# Patient Record
Sex: Male | Born: 1940 | Race: Black or African American | Hispanic: No | Marital: Married | Smoking: Never smoker
Health system: Southern US, Community
[De-identification: ages and names within clinical notes are randomized; demographics above are authoritative.]

## PROBLEM LIST (undated history)

## (undated) DIAGNOSIS — E119 Type 2 diabetes mellitus without complications: Secondary | ICD-10-CM

## (undated) DIAGNOSIS — I10 Essential (primary) hypertension: Secondary | ICD-10-CM

---

## 2006-06-09 ENCOUNTER — Ambulatory Visit: Payer: Self-pay | Admitting: Oncology

## 2006-07-22 ENCOUNTER — Ambulatory Visit: Payer: Self-pay | Admitting: Oncology

## 2016-07-10 ENCOUNTER — Encounter (HOSPITAL_COMMUNITY): Payer: Self-pay | Admitting: Emergency Medicine

## 2016-07-10 ENCOUNTER — Emergency Department (HOSPITAL_COMMUNITY)
Admission: EM | Admit: 2016-07-10 | Discharge: 2016-07-10 | Disposition: A | Discharge status: Home / Self Care | Payer: No Typology Code available for payment source | Attending: Emergency Medicine | Admitting: Emergency Medicine

## 2016-07-10 DIAGNOSIS — T148 Other injury of unspecified body region: Secondary | ICD-10-CM | POA: Diagnosis not present

## 2016-07-10 DIAGNOSIS — Y939 Activity, unspecified: Secondary | ICD-10-CM | POA: Diagnosis not present

## 2016-07-10 DIAGNOSIS — E119 Type 2 diabetes mellitus without complications: Secondary | ICD-10-CM | POA: Insufficient documentation

## 2016-07-10 DIAGNOSIS — R0789 Other chest pain: Secondary | ICD-10-CM | POA: Diagnosis not present

## 2016-07-10 DIAGNOSIS — I10 Essential (primary) hypertension: Secondary | ICD-10-CM | POA: Insufficient documentation

## 2016-07-10 DIAGNOSIS — Y999 Unspecified external cause status: Secondary | ICD-10-CM | POA: Insufficient documentation

## 2016-07-10 DIAGNOSIS — M79651 Pain in right thigh: Secondary | ICD-10-CM | POA: Diagnosis not present

## 2016-07-10 DIAGNOSIS — Y92410 Unspecified street and highway as the place of occurrence of the external cause: Secondary | ICD-10-CM | POA: Diagnosis not present

## 2016-07-10 HISTORY — DX: Essential (primary) hypertension: I10

## 2016-07-10 HISTORY — DX: Type 2 diabetes mellitus without complications: E11.9
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
## 2016-07-10 NOTE — Discharge Instructions (Signed)
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
## 2017-03-08 DIAGNOSIS — R7989 Other specified abnormal findings of blood chemistry: Secondary | ICD-10-CM | POA: Diagnosis not present

## 2017-03-08 DIAGNOSIS — R4182 Altered mental status, unspecified: Secondary | ICD-10-CM | POA: Diagnosis not present

## 2017-03-08 DIAGNOSIS — E1165 Type 2 diabetes mellitus with hyperglycemia: Secondary | ICD-10-CM | POA: Diagnosis not present

## 2017-03-08 DIAGNOSIS — R7309 Other abnormal glucose: Secondary | ICD-10-CM | POA: Diagnosis not present

## 2017-03-08 DIAGNOSIS — R531 Weakness: Secondary | ICD-10-CM | POA: Diagnosis not present

## 2017-03-31 DIAGNOSIS — I10 Essential (primary) hypertension: Secondary | ICD-10-CM | POA: Diagnosis not present

## 2017-03-31 DIAGNOSIS — I252 Old myocardial infarction: Secondary | ICD-10-CM | POA: Diagnosis not present

## 2017-03-31 DIAGNOSIS — E119 Type 2 diabetes mellitus without complications: Secondary | ICD-10-CM | POA: Diagnosis not present

## 2017-03-31 DIAGNOSIS — I4581 Long QT syndrome: Secondary | ICD-10-CM | POA: Diagnosis not present

## 2017-03-31 DIAGNOSIS — R5383 Other fatigue: Secondary | ICD-10-CM | POA: Diagnosis not present

## 2017-03-31 DIAGNOSIS — I499 Cardiac arrhythmia, unspecified: Secondary | ICD-10-CM | POA: Diagnosis not present

## 2017-03-31 DIAGNOSIS — R4781 Slurred speech: Secondary | ICD-10-CM | POA: Diagnosis not present

## 2017-03-31 DIAGNOSIS — I959 Hypotension, unspecified: Secondary | ICD-10-CM | POA: Diagnosis not present

## 2017-03-31 DIAGNOSIS — R350 Frequency of micturition: Secondary | ICD-10-CM | POA: Diagnosis not present

## 2017-03-31 DIAGNOSIS — D696 Thrombocytopenia, unspecified: Secondary | ICD-10-CM | POA: Diagnosis not present

## 2017-05-03 DIAGNOSIS — M109 Gout, unspecified: Secondary | ICD-10-CM | POA: Diagnosis not present

## 2017-05-03 DIAGNOSIS — Z1329 Encounter for screening for other suspected endocrine disorder: Secondary | ICD-10-CM | POA: Diagnosis not present

## 2017-05-03 DIAGNOSIS — E1129 Type 2 diabetes mellitus with other diabetic kidney complication: Secondary | ICD-10-CM | POA: Diagnosis not present

## 2017-05-03 DIAGNOSIS — E1169 Type 2 diabetes mellitus with other specified complication: Secondary | ICD-10-CM | POA: Diagnosis not present

## 2017-05-24 DIAGNOSIS — M109 Gout, unspecified: Secondary | ICD-10-CM | POA: Diagnosis not present

## 2017-05-24 DIAGNOSIS — E1129 Type 2 diabetes mellitus with other diabetic kidney complication: Secondary | ICD-10-CM | POA: Diagnosis not present

## 2017-05-24 DIAGNOSIS — M7989 Other specified soft tissue disorders: Secondary | ICD-10-CM | POA: Diagnosis not present

## 2017-05-24 DIAGNOSIS — E1159 Type 2 diabetes mellitus with other circulatory complications: Secondary | ICD-10-CM | POA: Diagnosis not present

## 2017-08-22 DIAGNOSIS — R55 Syncope and collapse: Secondary | ICD-10-CM | POA: Diagnosis not present

## 2017-08-22 DIAGNOSIS — R079 Chest pain, unspecified: Secondary | ICD-10-CM | POA: Diagnosis not present

## 2017-08-22 DIAGNOSIS — R404 Transient alteration of awareness: Secondary | ICD-10-CM | POA: Diagnosis not present

## 2017-08-22 DIAGNOSIS — I959 Hypotension, unspecified: Secondary | ICD-10-CM | POA: Diagnosis not present

## 2017-09-27 DIAGNOSIS — I129 Hypertensive chronic kidney disease with stage 1 through stage 4 chronic kidney disease, or unspecified chronic kidney disease: Secondary | ICD-10-CM | POA: Diagnosis not present

## 2017-09-27 DIAGNOSIS — R531 Weakness: Secondary | ICD-10-CM | POA: Diagnosis not present

## 2017-09-27 DIAGNOSIS — N189 Chronic kidney disease, unspecified: Secondary | ICD-10-CM | POA: Diagnosis not present

## 2017-10-25 DIAGNOSIS — K7581 Nonalcoholic steatohepatitis (NASH): Secondary | ICD-10-CM | POA: Diagnosis present

## 2017-10-25 DIAGNOSIS — F4489 Other dissociative and conversion disorders: Secondary | ICD-10-CM | POA: Diagnosis not present

## 2017-10-25 DIAGNOSIS — I120 Hypertensive chronic kidney disease with stage 5 chronic kidney disease or end stage renal disease: Secondary | ICD-10-CM | POA: Diagnosis not present

## 2017-10-25 DIAGNOSIS — E119 Type 2 diabetes mellitus without complications: Secondary | ICD-10-CM | POA: Diagnosis not present

## 2017-10-25 DIAGNOSIS — G9341 Metabolic encephalopathy: Secondary | ICD-10-CM | POA: Diagnosis not present

## 2017-10-25 DIAGNOSIS — D696 Thrombocytopenia, unspecified: Secondary | ICD-10-CM | POA: Diagnosis not present

## 2017-10-25 DIAGNOSIS — R402441 Other coma, without documented Glasgow coma scale score, or with partial score reported, in the field [EMT or ambulance]: Secondary | ICD-10-CM | POA: Diagnosis not present

## 2017-10-25 DIAGNOSIS — Z794 Long term (current) use of insulin: Secondary | ICD-10-CM | POA: Diagnosis not present

## 2017-10-25 DIAGNOSIS — R4182 Altered mental status, unspecified: Secondary | ICD-10-CM | POA: Diagnosis not present

## 2017-10-25 DIAGNOSIS — K7290 Hepatic failure, unspecified without coma: Secondary | ICD-10-CM | POA: Diagnosis present

## 2017-10-25 DIAGNOSIS — N185 Chronic kidney disease, stage 5: Secondary | ICD-10-CM | POA: Diagnosis not present

## 2017-10-25 DIAGNOSIS — K7460 Unspecified cirrhosis of liver: Secondary | ICD-10-CM | POA: Diagnosis not present

## 2017-10-25 DIAGNOSIS — Z79899 Other long term (current) drug therapy: Secondary | ICD-10-CM | POA: Diagnosis not present

## 2017-10-25 DIAGNOSIS — K8020 Calculus of gallbladder without cholecystitis without obstruction: Secondary | ICD-10-CM | POA: Diagnosis not present

## 2017-10-25 DIAGNOSIS — E1122 Type 2 diabetes mellitus with diabetic chronic kidney disease: Secondary | ICD-10-CM | POA: Diagnosis present

## 2017-10-25 DIAGNOSIS — E7220 Disorder of urea cycle metabolism, unspecified: Secondary | ICD-10-CM | POA: Diagnosis not present

## 2017-10-25 DIAGNOSIS — N400 Enlarged prostate without lower urinary tract symptoms: Secondary | ICD-10-CM | POA: Diagnosis present

## 2017-11-03 DIAGNOSIS — K7290 Hepatic failure, unspecified without coma: Secondary | ICD-10-CM | POA: Diagnosis not present

## 2017-11-14 DIAGNOSIS — K7290 Hepatic failure, unspecified without coma: Secondary | ICD-10-CM | POA: Diagnosis not present

## 2017-11-22 DIAGNOSIS — E1129 Type 2 diabetes mellitus with other diabetic kidney complication: Secondary | ICD-10-CM | POA: Diagnosis not present

## 2017-11-22 DIAGNOSIS — K7290 Hepatic failure, unspecified without coma: Secondary | ICD-10-CM | POA: Diagnosis not present

## 2017-12-06 DIAGNOSIS — E1169 Type 2 diabetes mellitus with other specified complication: Secondary | ICD-10-CM | POA: Diagnosis not present

## 2017-12-06 DIAGNOSIS — K7290 Hepatic failure, unspecified without coma: Secondary | ICD-10-CM | POA: Diagnosis not present

## 2017-12-06 DIAGNOSIS — Z1329 Encounter for screening for other suspected endocrine disorder: Secondary | ICD-10-CM | POA: Diagnosis not present

## 2017-12-06 DIAGNOSIS — E1129 Type 2 diabetes mellitus with other diabetic kidney complication: Secondary | ICD-10-CM | POA: Diagnosis not present

## 2017-12-06 DIAGNOSIS — M109 Gout, unspecified: Secondary | ICD-10-CM | POA: Diagnosis not present

## 2017-12-12 DIAGNOSIS — Z23 Encounter for immunization: Secondary | ICD-10-CM | POA: Diagnosis not present

## 2018-01-05 DIAGNOSIS — K7290 Hepatic failure, unspecified without coma: Secondary | ICD-10-CM | POA: Diagnosis not present

## 2018-01-05 DIAGNOSIS — E1129 Type 2 diabetes mellitus with other diabetic kidney complication: Secondary | ICD-10-CM | POA: Diagnosis not present

## 2018-01-05 DIAGNOSIS — M109 Gout, unspecified: Secondary | ICD-10-CM | POA: Diagnosis not present

## 2018-03-30 DIAGNOSIS — E1129 Type 2 diabetes mellitus with other diabetic kidney complication: Secondary | ICD-10-CM | POA: Diagnosis not present

## 2018-03-30 DIAGNOSIS — Z125 Encounter for screening for malignant neoplasm of prostate: Secondary | ICD-10-CM | POA: Diagnosis not present

## 2018-03-30 DIAGNOSIS — E1169 Type 2 diabetes mellitus with other specified complication: Secondary | ICD-10-CM | POA: Diagnosis not present

## 2018-05-05 DIAGNOSIS — E119 Type 2 diabetes mellitus without complications: Secondary | ICD-10-CM | POA: Diagnosis not present

## 2018-05-05 DIAGNOSIS — H2513 Age-related nuclear cataract, bilateral: Secondary | ICD-10-CM | POA: Diagnosis not present

## 2018-06-18 DIAGNOSIS — K7290 Hepatic failure, unspecified without coma: Secondary | ICD-10-CM | POA: Diagnosis not present

## 2018-06-18 DIAGNOSIS — E1129 Type 2 diabetes mellitus with other diabetic kidney complication: Secondary | ICD-10-CM | POA: Diagnosis not present

## 2018-06-18 DIAGNOSIS — I10 Essential (primary) hypertension: Secondary | ICD-10-CM | POA: Diagnosis not present

## 2018-06-18 DIAGNOSIS — E1159 Type 2 diabetes mellitus with other circulatory complications: Secondary | ICD-10-CM | POA: Diagnosis not present

## 2018-08-24 DIAGNOSIS — E119 Type 2 diabetes mellitus without complications: Secondary | ICD-10-CM | POA: Diagnosis not present

## 2018-08-24 DIAGNOSIS — N400 Enlarged prostate without lower urinary tract symptoms: Secondary | ICD-10-CM | POA: Diagnosis present

## 2018-08-24 DIAGNOSIS — D696 Thrombocytopenia, unspecified: Secondary | ICD-10-CM | POA: Diagnosis present

## 2018-08-24 DIAGNOSIS — K7469 Other cirrhosis of liver: Secondary | ICD-10-CM | POA: Diagnosis present

## 2018-08-24 DIAGNOSIS — R531 Weakness: Secondary | ICD-10-CM | POA: Diagnosis not present

## 2018-08-24 DIAGNOSIS — I10 Essential (primary) hypertension: Secondary | ICD-10-CM | POA: Diagnosis not present

## 2018-08-24 DIAGNOSIS — I129 Hypertensive chronic kidney disease with stage 1 through stage 4 chronic kidney disease, or unspecified chronic kidney disease: Secondary | ICD-10-CM | POA: Diagnosis present

## 2018-08-24 DIAGNOSIS — R410 Disorientation, unspecified: Secondary | ICD-10-CM | POA: Diagnosis not present

## 2018-08-24 DIAGNOSIS — E1122 Type 2 diabetes mellitus with diabetic chronic kidney disease: Secondary | ICD-10-CM | POA: Diagnosis present

## 2018-08-24 DIAGNOSIS — N289 Disorder of kidney and ureter, unspecified: Secondary | ICD-10-CM | POA: Diagnosis not present

## 2018-08-24 DIAGNOSIS — K7290 Hepatic failure, unspecified without coma: Secondary | ICD-10-CM | POA: Diagnosis present

## 2018-08-24 DIAGNOSIS — G9341 Metabolic encephalopathy: Secondary | ICD-10-CM | POA: Diagnosis not present

## 2018-08-24 DIAGNOSIS — Z79899 Other long term (current) drug therapy: Secondary | ICD-10-CM | POA: Diagnosis not present

## 2018-08-24 DIAGNOSIS — Z794 Long term (current) use of insulin: Secondary | ICD-10-CM | POA: Diagnosis not present

## 2018-08-24 DIAGNOSIS — K7460 Unspecified cirrhosis of liver: Secondary | ICD-10-CM | POA: Diagnosis not present

## 2018-08-24 DIAGNOSIS — R404 Transient alteration of awareness: Secondary | ICD-10-CM | POA: Diagnosis not present

## 2018-08-24 DIAGNOSIS — R456 Violent behavior: Secondary | ICD-10-CM | POA: Diagnosis not present

## 2018-08-24 DIAGNOSIS — R4182 Altered mental status, unspecified: Secondary | ICD-10-CM | POA: Diagnosis not present

## 2018-08-24 DIAGNOSIS — N183 Chronic kidney disease, stage 3 (moderate): Secondary | ICD-10-CM | POA: Diagnosis present

## 2018-09-18 DIAGNOSIS — I10 Essential (primary) hypertension: Secondary | ICD-10-CM | POA: Diagnosis not present

## 2018-09-18 DIAGNOSIS — E1159 Type 2 diabetes mellitus with other circulatory complications: Secondary | ICD-10-CM | POA: Diagnosis not present

## 2018-09-18 DIAGNOSIS — E1129 Type 2 diabetes mellitus with other diabetic kidney complication: Secondary | ICD-10-CM | POA: Diagnosis not present

## 2018-09-18 DIAGNOSIS — K7290 Hepatic failure, unspecified without coma: Secondary | ICD-10-CM | POA: Diagnosis not present

## 2018-10-19 DIAGNOSIS — K7290 Hepatic failure, unspecified without coma: Secondary | ICD-10-CM | POA: Diagnosis not present

## 2018-10-19 DIAGNOSIS — E1159 Type 2 diabetes mellitus with other circulatory complications: Secondary | ICD-10-CM | POA: Diagnosis not present

## 2018-10-19 DIAGNOSIS — I10 Essential (primary) hypertension: Secondary | ICD-10-CM | POA: Diagnosis not present

## 2018-10-19 DIAGNOSIS — E1129 Type 2 diabetes mellitus with other diabetic kidney complication: Secondary | ICD-10-CM | POA: Diagnosis not present

## 2019-06-25 DIAGNOSIS — D696 Thrombocytopenia, unspecified: Secondary | ICD-10-CM | POA: Diagnosis not present

## 2019-06-25 DIAGNOSIS — Z7984 Long term (current) use of oral hypoglycemic drugs: Secondary | ICD-10-CM | POA: Diagnosis not present

## 2019-06-25 DIAGNOSIS — S065X9A Traumatic subdural hemorrhage with loss of consciousness of unspecified duration, initial encounter: Secondary | ICD-10-CM | POA: Diagnosis not present

## 2019-06-25 DIAGNOSIS — K8020 Calculus of gallbladder without cholecystitis without obstruction: Secondary | ICD-10-CM | POA: Diagnosis not present

## 2019-06-25 DIAGNOSIS — Z79899 Other long term (current) drug therapy: Secondary | ICD-10-CM | POA: Diagnosis not present

## 2019-06-25 DIAGNOSIS — R4182 Altered mental status, unspecified: Secondary | ICD-10-CM | POA: Diagnosis not present

## 2019-06-25 DIAGNOSIS — R410 Disorientation, unspecified: Secondary | ICD-10-CM | POA: Diagnosis not present

## 2019-06-25 DIAGNOSIS — I252 Old myocardial infarction: Secondary | ICD-10-CM | POA: Diagnosis not present

## 2019-06-25 DIAGNOSIS — I10 Essential (primary) hypertension: Secondary | ICD-10-CM | POA: Diagnosis not present

## 2019-06-25 DIAGNOSIS — E119 Type 2 diabetes mellitus without complications: Secondary | ICD-10-CM | POA: Diagnosis not present

## 2019-06-25 DIAGNOSIS — R5381 Other malaise: Secondary | ICD-10-CM | POA: Diagnosis not present

## 2019-06-25 DIAGNOSIS — I129 Hypertensive chronic kidney disease with stage 1 through stage 4 chronic kidney disease, or unspecified chronic kidney disease: Secondary | ICD-10-CM | POA: Diagnosis not present

## 2019-06-25 DIAGNOSIS — I6200 Nontraumatic subdural hemorrhage, unspecified: Secondary | ICD-10-CM | POA: Diagnosis not present

## 2019-06-25 DIAGNOSIS — I517 Cardiomegaly: Secondary | ICD-10-CM | POA: Diagnosis not present

## 2019-06-25 DIAGNOSIS — R404 Transient alteration of awareness: Secondary | ICD-10-CM | POA: Diagnosis not present

## 2019-06-25 DIAGNOSIS — N189 Chronic kidney disease, unspecified: Secondary | ICD-10-CM | POA: Diagnosis not present

## 2019-06-25 DIAGNOSIS — W1830XA Fall on same level, unspecified, initial encounter: Secondary | ICD-10-CM | POA: Diagnosis not present

## 2019-06-25 DIAGNOSIS — R531 Weakness: Secondary | ICD-10-CM | POA: Diagnosis not present

## 2019-06-26 DIAGNOSIS — Z9181 History of falling: Secondary | ICD-10-CM | POA: Diagnosis not present

## 2019-06-26 DIAGNOSIS — D696 Thrombocytopenia, unspecified: Secondary | ICD-10-CM | POA: Diagnosis present

## 2019-06-26 DIAGNOSIS — W1830XA Fall on same level, unspecified, initial encounter: Secondary | ICD-10-CM | POA: Diagnosis not present

## 2019-06-26 DIAGNOSIS — S065X0A Traumatic subdural hemorrhage without loss of consciousness, initial encounter: Secondary | ICD-10-CM | POA: Diagnosis not present

## 2019-06-26 DIAGNOSIS — R410 Disorientation, unspecified: Secondary | ICD-10-CM | POA: Diagnosis not present

## 2019-06-26 DIAGNOSIS — I951 Orthostatic hypotension: Secondary | ICD-10-CM | POA: Diagnosis not present

## 2019-06-26 DIAGNOSIS — N179 Acute kidney failure, unspecified: Secondary | ICD-10-CM | POA: Diagnosis not present

## 2019-06-26 DIAGNOSIS — K7460 Unspecified cirrhosis of liver: Secondary | ICD-10-CM | POA: Diagnosis not present

## 2019-06-26 DIAGNOSIS — S065X9A Traumatic subdural hemorrhage with loss of consciousness of unspecified duration, initial encounter: Secondary | ICD-10-CM | POA: Diagnosis not present

## 2019-06-26 DIAGNOSIS — E119 Type 2 diabetes mellitus without complications: Secondary | ICD-10-CM | POA: Diagnosis not present

## 2019-06-26 DIAGNOSIS — R402412 Glasgow coma scale score 13-15, at arrival to emergency department: Secondary | ICD-10-CM | POA: Diagnosis present

## 2019-06-26 DIAGNOSIS — D689 Coagulation defect, unspecified: Secondary | ICD-10-CM | POA: Diagnosis not present

## 2019-06-26 DIAGNOSIS — I6200 Nontraumatic subdural hemorrhage, unspecified: Secondary | ICD-10-CM | POA: Diagnosis not present

## 2019-06-26 DIAGNOSIS — N183 Chronic kidney disease, stage 3 (moderate): Secondary | ICD-10-CM | POA: Diagnosis present

## 2019-06-26 DIAGNOSIS — K766 Portal hypertension: Secondary | ICD-10-CM | POA: Diagnosis not present

## 2019-06-26 DIAGNOSIS — E878 Other disorders of electrolyte and fluid balance, not elsewhere classified: Secondary | ICD-10-CM | POA: Diagnosis present

## 2019-06-26 DIAGNOSIS — N400 Enlarged prostate without lower urinary tract symptoms: Secondary | ICD-10-CM | POA: Diagnosis present

## 2019-06-26 DIAGNOSIS — K7290 Hepatic failure, unspecified without coma: Secondary | ICD-10-CM | POA: Diagnosis present

## 2019-06-26 DIAGNOSIS — E872 Acidosis: Secondary | ICD-10-CM | POA: Diagnosis not present

## 2019-06-26 DIAGNOSIS — I10 Essential (primary) hypertension: Secondary | ICD-10-CM | POA: Diagnosis not present

## 2019-06-26 DIAGNOSIS — D631 Anemia in chronic kidney disease: Secondary | ICD-10-CM | POA: Diagnosis present

## 2019-06-26 DIAGNOSIS — I129 Hypertensive chronic kidney disease with stage 1 through stage 4 chronic kidney disease, or unspecified chronic kidney disease: Secondary | ICD-10-CM | POA: Diagnosis present

## 2019-06-26 DIAGNOSIS — R001 Bradycardia, unspecified: Secondary | ICD-10-CM | POA: Diagnosis not present

## 2019-06-26 DIAGNOSIS — W19XXXA Unspecified fall, initial encounter: Secondary | ICD-10-CM | POA: Diagnosis not present

## 2019-06-26 DIAGNOSIS — K8020 Calculus of gallbladder without cholecystitis without obstruction: Secondary | ICD-10-CM | POA: Diagnosis not present

## 2019-06-26 DIAGNOSIS — E1122 Type 2 diabetes mellitus with diabetic chronic kidney disease: Secondary | ICD-10-CM | POA: Diagnosis present

## 2019-06-26 DIAGNOSIS — Z794 Long term (current) use of insulin: Secondary | ICD-10-CM | POA: Diagnosis not present

## 2019-06-26 DIAGNOSIS — Z1159 Encounter for screening for other viral diseases: Secondary | ICD-10-CM | POA: Diagnosis not present

## 2019-06-26 DIAGNOSIS — I252 Old myocardial infarction: Secondary | ICD-10-CM | POA: Diagnosis not present

## 2019-06-26 DIAGNOSIS — N189 Chronic kidney disease, unspecified: Secondary | ICD-10-CM | POA: Diagnosis not present

## 2019-06-29 DIAGNOSIS — W19XXXD Unspecified fall, subsequent encounter: Secondary | ICD-10-CM | POA: Diagnosis not present

## 2019-06-29 DIAGNOSIS — I129 Hypertensive chronic kidney disease with stage 1 through stage 4 chronic kidney disease, or unspecified chronic kidney disease: Secondary | ICD-10-CM | POA: Diagnosis not present

## 2019-06-29 DIAGNOSIS — S065X0D Traumatic subdural hemorrhage without loss of consciousness, subsequent encounter: Secondary | ICD-10-CM | POA: Diagnosis not present

## 2019-06-29 DIAGNOSIS — Z9181 History of falling: Secondary | ICD-10-CM | POA: Diagnosis not present

## 2019-06-29 DIAGNOSIS — R4182 Altered mental status, unspecified: Secondary | ICD-10-CM | POA: Diagnosis not present

## 2019-06-29 DIAGNOSIS — E1122 Type 2 diabetes mellitus with diabetic chronic kidney disease: Secondary | ICD-10-CM | POA: Diagnosis not present

## 2019-06-29 DIAGNOSIS — E7220 Disorder of urea cycle metabolism, unspecified: Secondary | ICD-10-CM | POA: Diagnosis not present

## 2019-06-29 DIAGNOSIS — F1011 Alcohol abuse, in remission: Secondary | ICD-10-CM | POA: Diagnosis not present

## 2019-06-29 DIAGNOSIS — Z6835 Body mass index (BMI) 35.0-35.9, adult: Secondary | ICD-10-CM | POA: Diagnosis not present

## 2019-06-29 DIAGNOSIS — Z794 Long term (current) use of insulin: Secondary | ICD-10-CM | POA: Diagnosis not present

## 2019-06-29 DIAGNOSIS — K7290 Hepatic failure, unspecified without coma: Secondary | ICD-10-CM | POA: Diagnosis not present

## 2019-06-29 DIAGNOSIS — N183 Chronic kidney disease, stage 3 (moderate): Secondary | ICD-10-CM | POA: Diagnosis not present

## 2019-06-29 DIAGNOSIS — K7460 Unspecified cirrhosis of liver: Secondary | ICD-10-CM | POA: Diagnosis not present

## 2019-06-29 DIAGNOSIS — N400 Enlarged prostate without lower urinary tract symptoms: Secondary | ICD-10-CM | POA: Diagnosis not present

## 2019-06-29 DIAGNOSIS — D631 Anemia in chronic kidney disease: Secondary | ICD-10-CM | POA: Diagnosis not present

## 2019-06-29 DIAGNOSIS — E11319 Type 2 diabetes mellitus with unspecified diabetic retinopathy without macular edema: Secondary | ICD-10-CM | POA: Diagnosis not present

## 2019-06-29 DIAGNOSIS — E785 Hyperlipidemia, unspecified: Secondary | ICD-10-CM | POA: Diagnosis not present

## 2019-06-29 DIAGNOSIS — E872 Acidosis: Secondary | ICD-10-CM | POA: Diagnosis not present

## 2019-06-29 DIAGNOSIS — I868 Varicose veins of other specified sites: Secondary | ICD-10-CM | POA: Diagnosis not present

## 2019-07-02 DIAGNOSIS — E7220 Disorder of urea cycle metabolism, unspecified: Secondary | ICD-10-CM | POA: Diagnosis not present

## 2019-07-02 DIAGNOSIS — E872 Acidosis: Secondary | ICD-10-CM | POA: Diagnosis not present

## 2019-07-02 DIAGNOSIS — R4182 Altered mental status, unspecified: Secondary | ICD-10-CM | POA: Diagnosis not present

## 2019-07-02 DIAGNOSIS — K7460 Unspecified cirrhosis of liver: Secondary | ICD-10-CM | POA: Diagnosis not present

## 2019-07-02 DIAGNOSIS — K7290 Hepatic failure, unspecified without coma: Secondary | ICD-10-CM | POA: Diagnosis not present

## 2019-07-02 DIAGNOSIS — S065X0D Traumatic subdural hemorrhage without loss of consciousness, subsequent encounter: Secondary | ICD-10-CM | POA: Diagnosis not present

## 2019-07-05 DIAGNOSIS — R4182 Altered mental status, unspecified: Secondary | ICD-10-CM | POA: Diagnosis not present

## 2019-07-05 DIAGNOSIS — K7460 Unspecified cirrhosis of liver: Secondary | ICD-10-CM | POA: Diagnosis not present

## 2019-07-05 DIAGNOSIS — E7220 Disorder of urea cycle metabolism, unspecified: Secondary | ICD-10-CM | POA: Diagnosis not present

## 2019-07-05 DIAGNOSIS — K7290 Hepatic failure, unspecified without coma: Secondary | ICD-10-CM | POA: Diagnosis not present

## 2019-07-05 DIAGNOSIS — E872 Acidosis: Secondary | ICD-10-CM | POA: Diagnosis not present

## 2019-07-05 DIAGNOSIS — S065X0D Traumatic subdural hemorrhage without loss of consciousness, subsequent encounter: Secondary | ICD-10-CM | POA: Diagnosis not present

## 2019-07-09 DIAGNOSIS — E7220 Disorder of urea cycle metabolism, unspecified: Secondary | ICD-10-CM | POA: Diagnosis not present

## 2019-07-09 DIAGNOSIS — K7460 Unspecified cirrhosis of liver: Secondary | ICD-10-CM | POA: Diagnosis not present

## 2019-07-09 DIAGNOSIS — S065X0D Traumatic subdural hemorrhage without loss of consciousness, subsequent encounter: Secondary | ICD-10-CM | POA: Diagnosis not present

## 2019-07-09 DIAGNOSIS — R4182 Altered mental status, unspecified: Secondary | ICD-10-CM | POA: Diagnosis not present

## 2019-07-09 DIAGNOSIS — K7290 Hepatic failure, unspecified without coma: Secondary | ICD-10-CM | POA: Diagnosis not present

## 2019-07-09 DIAGNOSIS — E872 Acidosis: Secondary | ICD-10-CM | POA: Diagnosis not present

## 2019-07-10 DIAGNOSIS — E7220 Disorder of urea cycle metabolism, unspecified: Secondary | ICD-10-CM | POA: Diagnosis not present

## 2019-07-10 DIAGNOSIS — K7460 Unspecified cirrhosis of liver: Secondary | ICD-10-CM | POA: Diagnosis not present

## 2019-07-10 DIAGNOSIS — R4182 Altered mental status, unspecified: Secondary | ICD-10-CM | POA: Diagnosis not present

## 2019-07-10 DIAGNOSIS — S065X0D Traumatic subdural hemorrhage without loss of consciousness, subsequent encounter: Secondary | ICD-10-CM | POA: Diagnosis not present

## 2019-07-10 DIAGNOSIS — E872 Acidosis: Secondary | ICD-10-CM | POA: Diagnosis not present

## 2019-07-10 DIAGNOSIS — K7290 Hepatic failure, unspecified without coma: Secondary | ICD-10-CM | POA: Diagnosis not present

## 2019-07-17 DIAGNOSIS — K7460 Unspecified cirrhosis of liver: Secondary | ICD-10-CM | POA: Diagnosis not present

## 2019-07-17 DIAGNOSIS — R4182 Altered mental status, unspecified: Secondary | ICD-10-CM | POA: Diagnosis not present

## 2019-07-17 DIAGNOSIS — K7290 Hepatic failure, unspecified without coma: Secondary | ICD-10-CM | POA: Diagnosis not present

## 2019-07-17 DIAGNOSIS — E872 Acidosis: Secondary | ICD-10-CM | POA: Diagnosis not present

## 2019-07-17 DIAGNOSIS — S065X0D Traumatic subdural hemorrhage without loss of consciousness, subsequent encounter: Secondary | ICD-10-CM | POA: Diagnosis not present

## 2019-07-17 DIAGNOSIS — E7220 Disorder of urea cycle metabolism, unspecified: Secondary | ICD-10-CM | POA: Diagnosis not present

## 2019-07-18 DIAGNOSIS — E872 Acidosis: Secondary | ICD-10-CM | POA: Diagnosis not present

## 2019-07-18 DIAGNOSIS — K7290 Hepatic failure, unspecified without coma: Secondary | ICD-10-CM | POA: Diagnosis not present

## 2019-07-18 DIAGNOSIS — E7220 Disorder of urea cycle metabolism, unspecified: Secondary | ICD-10-CM | POA: Diagnosis not present

## 2019-07-18 DIAGNOSIS — R4182 Altered mental status, unspecified: Secondary | ICD-10-CM | POA: Diagnosis not present

## 2019-07-18 DIAGNOSIS — K7460 Unspecified cirrhosis of liver: Secondary | ICD-10-CM | POA: Diagnosis not present

## 2019-07-18 DIAGNOSIS — S065X0D Traumatic subdural hemorrhage without loss of consciousness, subsequent encounter: Secondary | ICD-10-CM | POA: Diagnosis not present

## 2019-07-20 ENCOUNTER — Other Ambulatory Visit: Payer: Self-pay

## 2019-07-24 DIAGNOSIS — K7460 Unspecified cirrhosis of liver: Secondary | ICD-10-CM | POA: Diagnosis not present

## 2019-07-24 DIAGNOSIS — K7290 Hepatic failure, unspecified without coma: Secondary | ICD-10-CM | POA: Diagnosis not present

## 2019-07-24 DIAGNOSIS — E872 Acidosis: Secondary | ICD-10-CM | POA: Diagnosis not present

## 2019-07-24 DIAGNOSIS — E7220 Disorder of urea cycle metabolism, unspecified: Secondary | ICD-10-CM | POA: Diagnosis not present

## 2019-07-24 DIAGNOSIS — R4182 Altered mental status, unspecified: Secondary | ICD-10-CM | POA: Diagnosis not present

## 2019-07-24 DIAGNOSIS — S065X0D Traumatic subdural hemorrhage without loss of consciousness, subsequent encounter: Secondary | ICD-10-CM | POA: Diagnosis not present

## 2019-07-29 DIAGNOSIS — I129 Hypertensive chronic kidney disease with stage 1 through stage 4 chronic kidney disease, or unspecified chronic kidney disease: Secondary | ICD-10-CM | POA: Diagnosis not present

## 2019-07-29 DIAGNOSIS — D696 Thrombocytopenia, unspecified: Secondary | ICD-10-CM | POA: Diagnosis not present

## 2019-07-29 DIAGNOSIS — R4182 Altered mental status, unspecified: Secondary | ICD-10-CM | POA: Diagnosis not present

## 2019-07-29 DIAGNOSIS — Z79899 Other long term (current) drug therapy: Secondary | ICD-10-CM | POA: Diagnosis not present

## 2019-07-29 DIAGNOSIS — F1011 Alcohol abuse, in remission: Secondary | ICD-10-CM | POA: Diagnosis not present

## 2019-07-29 DIAGNOSIS — S065X0D Traumatic subdural hemorrhage without loss of consciousness, subsequent encounter: Secondary | ICD-10-CM | POA: Diagnosis not present

## 2019-07-29 DIAGNOSIS — N189 Chronic kidney disease, unspecified: Secondary | ICD-10-CM | POA: Diagnosis not present

## 2019-07-29 DIAGNOSIS — E11319 Type 2 diabetes mellitus with unspecified diabetic retinopathy without macular edema: Secondary | ICD-10-CM | POA: Diagnosis not present

## 2019-07-29 DIAGNOSIS — Z794 Long term (current) use of insulin: Secondary | ICD-10-CM | POA: Diagnosis not present

## 2019-07-29 DIAGNOSIS — N179 Acute kidney failure, unspecified: Secondary | ICD-10-CM | POA: Diagnosis not present

## 2019-07-29 DIAGNOSIS — E7220 Disorder of urea cycle metabolism, unspecified: Secondary | ICD-10-CM | POA: Diagnosis not present

## 2019-07-29 DIAGNOSIS — R918 Other nonspecific abnormal finding of lung field: Secondary | ICD-10-CM | POA: Diagnosis not present

## 2019-07-29 DIAGNOSIS — G8321 Monoplegia of upper limb affecting right dominant side: Secondary | ICD-10-CM | POA: Diagnosis not present

## 2019-07-29 DIAGNOSIS — E785 Hyperlipidemia, unspecified: Secondary | ICD-10-CM | POA: Diagnosis not present

## 2019-07-29 DIAGNOSIS — R29702 NIHSS score 2: Secondary | ICD-10-CM | POA: Diagnosis not present

## 2019-07-29 DIAGNOSIS — K7290 Hepatic failure, unspecified without coma: Secondary | ICD-10-CM | POA: Diagnosis not present

## 2019-07-29 DIAGNOSIS — R270 Ataxia, unspecified: Secondary | ICD-10-CM | POA: Diagnosis not present

## 2019-07-29 DIAGNOSIS — W19XXXD Unspecified fall, subsequent encounter: Secondary | ICD-10-CM | POA: Diagnosis not present

## 2019-07-29 DIAGNOSIS — K7460 Unspecified cirrhosis of liver: Secondary | ICD-10-CM | POA: Diagnosis not present

## 2019-07-29 DIAGNOSIS — Z9181 History of falling: Secondary | ICD-10-CM | POA: Diagnosis not present

## 2019-07-29 DIAGNOSIS — M79641 Pain in right hand: Secondary | ICD-10-CM | POA: Diagnosis not present

## 2019-07-29 DIAGNOSIS — Z6835 Body mass index (BMI) 35.0-35.9, adult: Secondary | ICD-10-CM | POA: Diagnosis not present

## 2019-07-29 DIAGNOSIS — N183 Chronic kidney disease, stage 3 (moderate): Secondary | ICD-10-CM | POA: Diagnosis not present

## 2019-07-29 DIAGNOSIS — D631 Anemia in chronic kidney disease: Secondary | ICD-10-CM | POA: Diagnosis not present

## 2019-07-29 DIAGNOSIS — I868 Varicose veins of other specified sites: Secondary | ICD-10-CM | POA: Diagnosis not present

## 2019-07-29 DIAGNOSIS — N400 Enlarged prostate without lower urinary tract symptoms: Secondary | ICD-10-CM | POA: Diagnosis not present

## 2019-07-29 DIAGNOSIS — I6203 Nontraumatic chronic subdural hemorrhage: Secondary | ICD-10-CM | POA: Diagnosis not present

## 2019-07-29 DIAGNOSIS — E1122 Type 2 diabetes mellitus with diabetic chronic kidney disease: Secondary | ICD-10-CM | POA: Diagnosis not present

## 2019-07-29 DIAGNOSIS — I6782 Cerebral ischemia: Secondary | ICD-10-CM | POA: Diagnosis not present

## 2019-07-29 DIAGNOSIS — E872 Acidosis: Secondary | ICD-10-CM | POA: Diagnosis not present

## 2019-07-30 DIAGNOSIS — N179 Acute kidney failure, unspecified: Secondary | ICD-10-CM | POA: Diagnosis not present

## 2019-07-30 DIAGNOSIS — R1312 Dysphagia, oropharyngeal phase: Secondary | ICD-10-CM | POA: Diagnosis not present

## 2019-07-30 DIAGNOSIS — G839 Paralytic syndrome, unspecified: Secondary | ICD-10-CM | POA: Diagnosis not present

## 2019-07-30 DIAGNOSIS — R402224 Coma scale, best verbal response, incomprehensible words, 24 hours or more after hospital admission: Secondary | ICD-10-CM | POA: Diagnosis not present

## 2019-07-30 DIAGNOSIS — R1310 Dysphagia, unspecified: Secondary | ICD-10-CM | POA: Diagnosis not present

## 2019-07-30 DIAGNOSIS — G92 Toxic encephalopathy: Secondary | ICD-10-CM | POA: Diagnosis not present

## 2019-07-30 DIAGNOSIS — G9341 Metabolic encephalopathy: Secondary | ICD-10-CM | POA: Diagnosis present

## 2019-07-30 DIAGNOSIS — I10 Essential (primary) hypertension: Secondary | ICD-10-CM | POA: Diagnosis not present

## 2019-07-30 DIAGNOSIS — E785 Hyperlipidemia, unspecified: Secondary | ICD-10-CM | POA: Diagnosis present

## 2019-07-30 DIAGNOSIS — M79641 Pain in right hand: Secondary | ICD-10-CM | POA: Diagnosis not present

## 2019-07-30 DIAGNOSIS — R4182 Altered mental status, unspecified: Secondary | ICD-10-CM | POA: Diagnosis not present

## 2019-07-30 DIAGNOSIS — R4701 Aphasia: Secondary | ICD-10-CM | POA: Diagnosis present

## 2019-07-30 DIAGNOSIS — G9340 Encephalopathy, unspecified: Secondary | ICD-10-CM | POA: Diagnosis not present

## 2019-07-30 DIAGNOSIS — R001 Bradycardia, unspecified: Secondary | ICD-10-CM | POA: Diagnosis not present

## 2019-07-30 DIAGNOSIS — Z66 Do not resuscitate: Secondary | ICD-10-CM | POA: Diagnosis not present

## 2019-07-30 DIAGNOSIS — F05 Delirium due to known physiological condition: Secondary | ICD-10-CM | POA: Diagnosis present

## 2019-07-30 DIAGNOSIS — N183 Chronic kidney disease, stage 3 (moderate): Secondary | ICD-10-CM | POA: Diagnosis not present

## 2019-07-30 DIAGNOSIS — I129 Hypertensive chronic kidney disease with stage 1 through stage 4 chronic kidney disease, or unspecified chronic kidney disease: Secondary | ICD-10-CM | POA: Diagnosis not present

## 2019-07-30 DIAGNOSIS — W1839XA Other fall on same level, initial encounter: Secondary | ICD-10-CM | POA: Diagnosis not present

## 2019-07-30 DIAGNOSIS — K7460 Unspecified cirrhosis of liver: Secondary | ICD-10-CM | POA: Diagnosis not present

## 2019-07-30 DIAGNOSIS — R569 Unspecified convulsions: Secondary | ICD-10-CM | POA: Diagnosis not present

## 2019-07-30 DIAGNOSIS — E876 Hypokalemia: Secondary | ICD-10-CM | POA: Diagnosis not present

## 2019-07-30 DIAGNOSIS — G8384 Todd's paralysis (postepileptic): Secondary | ICD-10-CM | POA: Diagnosis not present

## 2019-07-30 DIAGNOSIS — E11319 Type 2 diabetes mellitus with unspecified diabetic retinopathy without macular edema: Secondary | ICD-10-CM | POA: Diagnosis present

## 2019-07-30 DIAGNOSIS — R29702 NIHSS score 2: Secondary | ICD-10-CM | POA: Diagnosis not present

## 2019-07-30 DIAGNOSIS — E8881 Metabolic syndrome: Secondary | ICD-10-CM | POA: Diagnosis present

## 2019-07-30 DIAGNOSIS — M109 Gout, unspecified: Secondary | ICD-10-CM | POA: Diagnosis present

## 2019-07-30 DIAGNOSIS — G8191 Hemiplegia, unspecified affecting right dominant side: Secondary | ICD-10-CM | POA: Diagnosis not present

## 2019-07-30 DIAGNOSIS — E1122 Type 2 diabetes mellitus with diabetic chronic kidney disease: Secondary | ICD-10-CM | POA: Diagnosis not present

## 2019-07-30 DIAGNOSIS — E872 Acidosis: Secondary | ICD-10-CM | POA: Diagnosis not present

## 2019-07-30 DIAGNOSIS — S065X9A Traumatic subdural hemorrhage with loss of consciousness of unspecified duration, initial encounter: Secondary | ICD-10-CM | POA: Diagnosis present

## 2019-07-30 DIAGNOSIS — Y33XXXA Other specified events, undetermined intent, initial encounter: Secondary | ICD-10-CM | POA: Diagnosis not present

## 2019-07-30 DIAGNOSIS — R29720 NIHSS score 20: Secondary | ICD-10-CM | POA: Diagnosis present

## 2019-07-30 DIAGNOSIS — E7220 Disorder of urea cycle metabolism, unspecified: Secondary | ICD-10-CM | POA: Diagnosis not present

## 2019-07-30 DIAGNOSIS — K7200 Acute and subacute hepatic failure without coma: Secondary | ICD-10-CM | POA: Diagnosis not present

## 2019-07-30 DIAGNOSIS — K7290 Hepatic failure, unspecified without coma: Secondary | ICD-10-CM | POA: Diagnosis not present

## 2019-07-30 DIAGNOSIS — R918 Other nonspecific abnormal finding of lung field: Secondary | ICD-10-CM | POA: Diagnosis not present

## 2019-07-30 DIAGNOSIS — D631 Anemia in chronic kidney disease: Secondary | ICD-10-CM | POA: Diagnosis present

## 2019-07-30 DIAGNOSIS — K7030 Alcoholic cirrhosis of liver without ascites: Secondary | ICD-10-CM | POA: Diagnosis not present

## 2019-07-30 DIAGNOSIS — N400 Enlarged prostate without lower urinary tract symptoms: Secondary | ICD-10-CM | POA: Diagnosis not present

## 2019-07-31 DIAGNOSIS — R569 Unspecified convulsions: Secondary | ICD-10-CM | POA: Diagnosis not present

## 2019-08-01 DIAGNOSIS — N179 Acute kidney failure, unspecified: Secondary | ICD-10-CM | POA: Diagnosis not present

## 2019-08-01 DIAGNOSIS — G92 Toxic encephalopathy: Secondary | ICD-10-CM | POA: Diagnosis not present

## 2019-08-01 DIAGNOSIS — E872 Acidosis: Secondary | ICD-10-CM | POA: Diagnosis not present

## 2019-08-01 DIAGNOSIS — N400 Enlarged prostate without lower urinary tract symptoms: Secondary | ICD-10-CM | POA: Diagnosis not present

## 2019-08-01 DIAGNOSIS — N183 Chronic kidney disease, stage 3 (moderate): Secondary | ICD-10-CM | POA: Diagnosis not present

## 2019-08-01 DIAGNOSIS — K7460 Unspecified cirrhosis of liver: Secondary | ICD-10-CM | POA: Diagnosis not present

## 2019-08-02 DIAGNOSIS — K7460 Unspecified cirrhosis of liver: Secondary | ICD-10-CM | POA: Diagnosis not present

## 2019-08-03 DIAGNOSIS — K7460 Unspecified cirrhosis of liver: Secondary | ICD-10-CM | POA: Diagnosis not present

## 2019-08-04 DIAGNOSIS — N183 Chronic kidney disease, stage 3 (moderate): Secondary | ICD-10-CM | POA: Diagnosis not present

## 2019-08-05 DIAGNOSIS — N400 Enlarged prostate without lower urinary tract symptoms: Secondary | ICD-10-CM | POA: Diagnosis not present

## 2019-08-06 DIAGNOSIS — N179 Acute kidney failure, unspecified: Secondary | ICD-10-CM | POA: Diagnosis not present

## 2019-08-06 DIAGNOSIS — R4182 Altered mental status, unspecified: Secondary | ICD-10-CM | POA: Diagnosis not present

## 2019-08-14 DIAGNOSIS — E872 Acidosis: Secondary | ICD-10-CM | POA: Diagnosis not present

## 2019-08-14 DIAGNOSIS — E7220 Disorder of urea cycle metabolism, unspecified: Secondary | ICD-10-CM | POA: Diagnosis not present

## 2019-08-14 DIAGNOSIS — K7290 Hepatic failure, unspecified without coma: Secondary | ICD-10-CM | POA: Diagnosis not present

## 2019-08-14 DIAGNOSIS — K7460 Unspecified cirrhosis of liver: Secondary | ICD-10-CM | POA: Diagnosis not present

## 2019-08-14 DIAGNOSIS — R4182 Altered mental status, unspecified: Secondary | ICD-10-CM | POA: Diagnosis not present

## 2019-08-14 DIAGNOSIS — S065X0D Traumatic subdural hemorrhage without loss of consciousness, subsequent encounter: Secondary | ICD-10-CM | POA: Diagnosis not present

## 2019-08-16 DIAGNOSIS — E872 Acidosis: Secondary | ICD-10-CM | POA: Diagnosis not present

## 2019-08-16 DIAGNOSIS — K7460 Unspecified cirrhosis of liver: Secondary | ICD-10-CM | POA: Diagnosis not present

## 2019-08-16 DIAGNOSIS — S065X0D Traumatic subdural hemorrhage without loss of consciousness, subsequent encounter: Secondary | ICD-10-CM | POA: Diagnosis not present

## 2019-08-16 DIAGNOSIS — E7220 Disorder of urea cycle metabolism, unspecified: Secondary | ICD-10-CM | POA: Diagnosis not present

## 2019-08-16 DIAGNOSIS — R4182 Altered mental status, unspecified: Secondary | ICD-10-CM | POA: Diagnosis not present

## 2019-08-16 DIAGNOSIS — K7290 Hepatic failure, unspecified without coma: Secondary | ICD-10-CM | POA: Diagnosis not present

## 2019-08-21 DIAGNOSIS — S065X0D Traumatic subdural hemorrhage without loss of consciousness, subsequent encounter: Secondary | ICD-10-CM | POA: Diagnosis not present

## 2019-08-21 DIAGNOSIS — R4182 Altered mental status, unspecified: Secondary | ICD-10-CM | POA: Diagnosis not present

## 2019-08-21 DIAGNOSIS — E872 Acidosis: Secondary | ICD-10-CM | POA: Diagnosis not present

## 2019-08-21 DIAGNOSIS — K7460 Unspecified cirrhosis of liver: Secondary | ICD-10-CM | POA: Diagnosis not present

## 2019-08-21 DIAGNOSIS — E7220 Disorder of urea cycle metabolism, unspecified: Secondary | ICD-10-CM | POA: Diagnosis not present

## 2019-08-21 DIAGNOSIS — K7290 Hepatic failure, unspecified without coma: Secondary | ICD-10-CM | POA: Diagnosis not present

## 2019-08-22 DIAGNOSIS — S065X0D Traumatic subdural hemorrhage without loss of consciousness, subsequent encounter: Secondary | ICD-10-CM | POA: Diagnosis not present

## 2019-08-22 DIAGNOSIS — K7460 Unspecified cirrhosis of liver: Secondary | ICD-10-CM | POA: Diagnosis not present

## 2019-08-22 DIAGNOSIS — R4182 Altered mental status, unspecified: Secondary | ICD-10-CM | POA: Diagnosis not present

## 2019-08-22 DIAGNOSIS — K7290 Hepatic failure, unspecified without coma: Secondary | ICD-10-CM | POA: Diagnosis not present

## 2019-08-22 DIAGNOSIS — E7220 Disorder of urea cycle metabolism, unspecified: Secondary | ICD-10-CM | POA: Diagnosis not present

## 2019-08-22 DIAGNOSIS — E872 Acidosis: Secondary | ICD-10-CM | POA: Diagnosis not present

## 2019-08-23 DIAGNOSIS — K7460 Unspecified cirrhosis of liver: Secondary | ICD-10-CM | POA: Diagnosis not present

## 2019-08-23 DIAGNOSIS — R4182 Altered mental status, unspecified: Secondary | ICD-10-CM | POA: Diagnosis not present

## 2019-08-23 DIAGNOSIS — K7290 Hepatic failure, unspecified without coma: Secondary | ICD-10-CM | POA: Diagnosis not present

## 2019-08-23 DIAGNOSIS — S065X0D Traumatic subdural hemorrhage without loss of consciousness, subsequent encounter: Secondary | ICD-10-CM | POA: Diagnosis not present

## 2019-08-23 DIAGNOSIS — E872 Acidosis: Secondary | ICD-10-CM | POA: Diagnosis not present

## 2019-08-23 DIAGNOSIS — E7220 Disorder of urea cycle metabolism, unspecified: Secondary | ICD-10-CM | POA: Diagnosis not present

## 2019-08-27 DIAGNOSIS — S065X0D Traumatic subdural hemorrhage without loss of consciousness, subsequent encounter: Secondary | ICD-10-CM | POA: Diagnosis not present

## 2019-08-27 DIAGNOSIS — E872 Acidosis: Secondary | ICD-10-CM | POA: Diagnosis not present

## 2019-08-27 DIAGNOSIS — R4182 Altered mental status, unspecified: Secondary | ICD-10-CM | POA: Diagnosis not present

## 2019-08-27 DIAGNOSIS — K7290 Hepatic failure, unspecified without coma: Secondary | ICD-10-CM | POA: Diagnosis not present

## 2019-08-27 DIAGNOSIS — K7200 Acute and subacute hepatic failure without coma: Secondary | ICD-10-CM | POA: Diagnosis not present

## 2019-08-27 DIAGNOSIS — E7220 Disorder of urea cycle metabolism, unspecified: Secondary | ICD-10-CM | POA: Diagnosis not present

## 2019-08-27 DIAGNOSIS — D631 Anemia in chronic kidney disease: Secondary | ICD-10-CM | POA: Diagnosis not present

## 2019-08-27 DIAGNOSIS — K7460 Unspecified cirrhosis of liver: Secondary | ICD-10-CM | POA: Diagnosis not present

## 2019-08-28 DIAGNOSIS — R29810 Facial weakness: Secondary | ICD-10-CM | POA: Diagnosis not present

## 2019-08-28 DIAGNOSIS — E785 Hyperlipidemia, unspecified: Secondary | ICD-10-CM | POA: Diagnosis not present

## 2019-08-28 DIAGNOSIS — E7220 Disorder of urea cycle metabolism, unspecified: Secondary | ICD-10-CM | POA: Diagnosis not present

## 2019-08-28 DIAGNOSIS — Z794 Long term (current) use of insulin: Secondary | ICD-10-CM | POA: Diagnosis not present

## 2019-08-28 DIAGNOSIS — Z792 Long term (current) use of antibiotics: Secondary | ICD-10-CM | POA: Diagnosis not present

## 2019-08-28 DIAGNOSIS — I129 Hypertensive chronic kidney disease with stage 1 through stage 4 chronic kidney disease, or unspecified chronic kidney disease: Secondary | ICD-10-CM | POA: Diagnosis not present

## 2019-08-28 DIAGNOSIS — S065X0S Traumatic subdural hemorrhage without loss of consciousness, sequela: Secondary | ICD-10-CM | POA: Diagnosis not present

## 2019-08-28 DIAGNOSIS — I951 Orthostatic hypotension: Secondary | ICD-10-CM | POA: Diagnosis not present

## 2019-08-28 DIAGNOSIS — R1312 Dysphagia, oropharyngeal phase: Secondary | ICD-10-CM | POA: Diagnosis not present

## 2019-08-28 DIAGNOSIS — E11319 Type 2 diabetes mellitus with unspecified diabetic retinopathy without macular edema: Secondary | ICD-10-CM | POA: Diagnosis not present

## 2019-08-28 DIAGNOSIS — R338 Other retention of urine: Secondary | ICD-10-CM | POA: Diagnosis not present

## 2019-08-28 DIAGNOSIS — E1122 Type 2 diabetes mellitus with diabetic chronic kidney disease: Secondary | ICD-10-CM | POA: Diagnosis not present

## 2019-08-28 DIAGNOSIS — E872 Acidosis: Secondary | ICD-10-CM | POA: Diagnosis not present

## 2019-08-28 DIAGNOSIS — I868 Varicose veins of other specified sites: Secondary | ICD-10-CM | POA: Diagnosis not present

## 2019-08-28 DIAGNOSIS — R001 Bradycardia, unspecified: Secondary | ICD-10-CM | POA: Diagnosis not present

## 2019-08-28 DIAGNOSIS — K7200 Acute and subacute hepatic failure without coma: Secondary | ICD-10-CM | POA: Diagnosis not present

## 2019-08-28 DIAGNOSIS — Z6830 Body mass index (BMI) 30.0-30.9, adult: Secondary | ICD-10-CM | POA: Diagnosis not present

## 2019-08-28 DIAGNOSIS — F1011 Alcohol abuse, in remission: Secondary | ICD-10-CM | POA: Diagnosis not present

## 2019-08-28 DIAGNOSIS — N183 Chronic kidney disease, stage 3 (moderate): Secondary | ICD-10-CM | POA: Diagnosis not present

## 2019-08-28 DIAGNOSIS — D631 Anemia in chronic kidney disease: Secondary | ICD-10-CM | POA: Diagnosis not present

## 2019-08-28 DIAGNOSIS — N401 Enlarged prostate with lower urinary tract symptoms: Secondary | ICD-10-CM | POA: Diagnosis not present

## 2019-08-28 DIAGNOSIS — R251 Tremor, unspecified: Secondary | ICD-10-CM | POA: Diagnosis not present

## 2019-08-28 DIAGNOSIS — R4781 Slurred speech: Secondary | ICD-10-CM | POA: Diagnosis not present

## 2019-08-28 DIAGNOSIS — M1030 Gout due to renal impairment, unspecified site: Secondary | ICD-10-CM | POA: Diagnosis not present

## 2019-08-28 DIAGNOSIS — K7460 Unspecified cirrhosis of liver: Secondary | ICD-10-CM | POA: Diagnosis not present

## 2019-08-30 ENCOUNTER — Emergency Department (HOSPITAL_COMMUNITY): Payer: Medicare Other

## 2019-08-30 ENCOUNTER — Inpatient Hospital Stay (HOSPITAL_COMMUNITY): Payer: Medicare Other

## 2019-08-30 ENCOUNTER — Encounter (HOSPITAL_COMMUNITY): Payer: Self-pay | Admitting: Internal Medicine

## 2019-08-30 ENCOUNTER — Inpatient Hospital Stay (HOSPITAL_COMMUNITY)
Admission: EM | Admit: 2019-08-30 | Discharge: 2019-09-04 | DRG: 441 | Disposition: A | Discharge status: Home Health | Payer: Medicare Other | Attending: Internal Medicine | Admitting: Internal Medicine

## 2019-08-30 DIAGNOSIS — N179 Acute kidney failure, unspecified: Secondary | ICD-10-CM | POA: Diagnosis not present

## 2019-08-30 DIAGNOSIS — F1020 Alcohol dependence, uncomplicated: Secondary | ICD-10-CM | POA: Diagnosis present

## 2019-08-30 DIAGNOSIS — J811 Chronic pulmonary edema: Secondary | ICD-10-CM | POA: Diagnosis not present

## 2019-08-30 DIAGNOSIS — M2550 Pain in unspecified joint: Secondary | ICD-10-CM | POA: Diagnosis not present

## 2019-08-30 DIAGNOSIS — Z20828 Contact with and (suspected) exposure to other viral communicable diseases: Secondary | ICD-10-CM | POA: Diagnosis present

## 2019-08-30 DIAGNOSIS — Z66 Do not resuscitate: Secondary | ICD-10-CM | POA: Diagnosis present

## 2019-08-30 DIAGNOSIS — G92 Toxic encephalopathy: Secondary | ICD-10-CM | POA: Diagnosis present

## 2019-08-30 DIAGNOSIS — N400 Enlarged prostate without lower urinary tract symptoms: Secondary | ICD-10-CM | POA: Diagnosis present

## 2019-08-30 DIAGNOSIS — G9340 Encephalopathy, unspecified: Secondary | ICD-10-CM | POA: Diagnosis not present

## 2019-08-30 DIAGNOSIS — R531 Weakness: Secondary | ICD-10-CM | POA: Diagnosis not present

## 2019-08-30 DIAGNOSIS — E872 Acidosis, unspecified: Secondary | ICD-10-CM

## 2019-08-30 DIAGNOSIS — R4182 Altered mental status, unspecified: Secondary | ICD-10-CM | POA: Diagnosis present

## 2019-08-30 DIAGNOSIS — K7460 Unspecified cirrhosis of liver: Secondary | ICD-10-CM | POA: Diagnosis not present

## 2019-08-30 DIAGNOSIS — R1310 Dysphagia, unspecified: Secondary | ICD-10-CM | POA: Diagnosis present

## 2019-08-30 DIAGNOSIS — Z7401 Bed confinement status: Secondary | ICD-10-CM | POA: Diagnosis not present

## 2019-08-30 DIAGNOSIS — Z794 Long term (current) use of insulin: Secondary | ICD-10-CM

## 2019-08-30 DIAGNOSIS — E1122 Type 2 diabetes mellitus with diabetic chronic kidney disease: Secondary | ICD-10-CM | POA: Diagnosis present

## 2019-08-30 DIAGNOSIS — D696 Thrombocytopenia, unspecified: Secondary | ICD-10-CM | POA: Diagnosis not present

## 2019-08-30 DIAGNOSIS — M109 Gout, unspecified: Secondary | ICD-10-CM | POA: Diagnosis not present

## 2019-08-30 DIAGNOSIS — E8770 Fluid overload, unspecified: Secondary | ICD-10-CM | POA: Diagnosis present

## 2019-08-30 DIAGNOSIS — I129 Hypertensive chronic kidney disease with stage 1 through stage 4 chronic kidney disease, or unspecified chronic kidney disease: Secondary | ICD-10-CM | POA: Diagnosis present

## 2019-08-30 DIAGNOSIS — I6200 Nontraumatic subdural hemorrhage, unspecified: Secondary | ICD-10-CM | POA: Diagnosis not present

## 2019-08-30 DIAGNOSIS — K7682 Hepatic encephalopathy: Secondary | ICD-10-CM

## 2019-08-30 DIAGNOSIS — R7989 Other specified abnormal findings of blood chemistry: Secondary | ICD-10-CM

## 2019-08-30 DIAGNOSIS — R29810 Facial weakness: Secondary | ICD-10-CM | POA: Diagnosis present

## 2019-08-30 DIAGNOSIS — Z515 Encounter for palliative care: Secondary | ICD-10-CM | POA: Diagnosis not present

## 2019-08-30 DIAGNOSIS — Z79899 Other long term (current) drug therapy: Secondary | ICD-10-CM | POA: Diagnosis not present

## 2019-08-30 DIAGNOSIS — I639 Cerebral infarction, unspecified: Secondary | ICD-10-CM | POA: Diagnosis not present

## 2019-08-30 DIAGNOSIS — R945 Abnormal results of liver function studies: Secondary | ICD-10-CM | POA: Diagnosis not present

## 2019-08-30 DIAGNOSIS — R569 Unspecified convulsions: Secondary | ICD-10-CM

## 2019-08-30 DIAGNOSIS — E785 Hyperlipidemia, unspecified: Secondary | ICD-10-CM | POA: Diagnosis present

## 2019-08-30 DIAGNOSIS — R404 Transient alteration of awareness: Secondary | ICD-10-CM | POA: Diagnosis not present

## 2019-08-30 DIAGNOSIS — D6959 Other secondary thrombocytopenia: Secondary | ICD-10-CM | POA: Diagnosis present

## 2019-08-30 DIAGNOSIS — I6203 Nontraumatic chronic subdural hemorrhage: Secondary | ICD-10-CM | POA: Diagnosis present

## 2019-08-30 DIAGNOSIS — S065XAA Traumatic subdural hemorrhage with loss of consciousness status unknown, initial encounter: Secondary | ICD-10-CM

## 2019-08-30 DIAGNOSIS — Z4682 Encounter for fitting and adjustment of non-vascular catheter: Secondary | ICD-10-CM | POA: Diagnosis not present

## 2019-08-30 DIAGNOSIS — E118 Type 2 diabetes mellitus with unspecified complications: Secondary | ICD-10-CM | POA: Diagnosis not present

## 2019-08-30 DIAGNOSIS — E11319 Type 2 diabetes mellitus with unspecified diabetic retinopathy without macular edema: Secondary | ICD-10-CM | POA: Diagnosis present

## 2019-08-30 DIAGNOSIS — E1151 Type 2 diabetes mellitus with diabetic peripheral angiopathy without gangrene: Secondary | ICD-10-CM | POA: Diagnosis present

## 2019-08-30 DIAGNOSIS — K7200 Acute and subacute hepatic failure without coma: Secondary | ICD-10-CM | POA: Diagnosis not present

## 2019-08-30 DIAGNOSIS — N184 Chronic kidney disease, stage 4 (severe): Secondary | ICD-10-CM | POA: Diagnosis not present

## 2019-08-30 DIAGNOSIS — S065X9A Traumatic subdural hemorrhage with loss of consciousness of unspecified duration, initial encounter: Secondary | ICD-10-CM

## 2019-08-30 DIAGNOSIS — G459 Transient cerebral ischemic attack, unspecified: Secondary | ICD-10-CM | POA: Diagnosis not present

## 2019-08-30 DIAGNOSIS — R29717 NIHSS score 17: Secondary | ICD-10-CM | POA: Diagnosis present

## 2019-08-30 DIAGNOSIS — I69351 Hemiplegia and hemiparesis following cerebral infarction affecting right dominant side: Secondary | ICD-10-CM | POA: Diagnosis not present

## 2019-08-30 DIAGNOSIS — R410 Disorientation, unspecified: Secondary | ICD-10-CM | POA: Diagnosis not present

## 2019-08-30 DIAGNOSIS — I509 Heart failure, unspecified: Secondary | ICD-10-CM | POA: Diagnosis not present

## 2019-08-30 DIAGNOSIS — F0390 Unspecified dementia without behavioral disturbance: Secondary | ICD-10-CM | POA: Diagnosis present

## 2019-08-30 DIAGNOSIS — K8020 Calculus of gallbladder without cholecystitis without obstruction: Secondary | ICD-10-CM | POA: Diagnosis not present

## 2019-08-30 DIAGNOSIS — I6202 Nontraumatic subacute subdural hemorrhage: Secondary | ICD-10-CM | POA: Diagnosis not present

## 2019-08-30 DIAGNOSIS — I10 Essential (primary) hypertension: Secondary | ICD-10-CM | POA: Diagnosis not present

## 2019-08-30 LAB — CBC
HCT: 35.2 % — ABNORMAL LOW (ref 39.0–52.0)
Hemoglobin: 11.5 g/dL — ABNORMAL LOW (ref 13.0–17.0)
MCH: 31.6 pg (ref 26.0–34.0)
MCHC: 32.7 g/dL (ref 30.0–36.0)
MCV: 96.7 fL (ref 80.0–100.0)
Platelets: 61 10*3/uL — ABNORMAL LOW (ref 150–400)
RBC: 3.64 MIL/uL — ABNORMAL LOW (ref 4.22–5.81)
RDW: 15.5 % (ref 11.5–15.5)
WBC: 5.6 10*3/uL (ref 4.0–10.5)
nRBC: 0 % (ref 0.0–0.2)

## 2019-08-30 LAB — COMPREHENSIVE METABOLIC PANEL
ALT: 23 U/L (ref 0–44)
AST: 21 U/L (ref 15–41)
Albumin: 2.8 g/dL — ABNORMAL LOW (ref 3.5–5.0)
Alkaline Phosphatase: 113 U/L (ref 38–126)
Anion gap: 10 (ref 5–15)
BUN: 51 mg/dL — ABNORMAL HIGH (ref 8–23)
CO2: 12 mmol/L — ABNORMAL LOW (ref 22–32)
Calcium: 9.3 mg/dL (ref 8.9–10.3)
Chloride: 117 mmol/L — ABNORMAL HIGH (ref 98–111)
Creatinine, Ser: 4.82 mg/dL — ABNORMAL HIGH (ref 0.61–1.24)
GFR calc Af Amer: 12 mL/min — ABNORMAL LOW (ref >60)
GFR calc non Af Amer: 11 mL/min — ABNORMAL LOW (ref >60)
Glucose, Bld: 127 mg/dL — ABNORMAL HIGH (ref 70–99)
Potassium: 4 mmol/L (ref 3.5–5.1)
Sodium: 139 mmol/L (ref 135–145)
Total Bilirubin: 2.1 mg/dL — ABNORMAL HIGH (ref 0.3–1.2)
Total Protein: 7.2 g/dL (ref 6.5–8.1)

## 2019-08-30 LAB — DIFFERENTIAL
Abs Immature Granulocytes: 0.03 10*3/uL (ref 0.00–0.07)
Basophils Absolute: 0 10*3/uL (ref 0.0–0.1)
Basophils Relative: 0 %
Eosinophils Absolute: 0.2 10*3/uL (ref 0.0–0.5)
Eosinophils Relative: 3 %
Immature Granulocytes: 1 %
Lymphocytes Relative: 17 %
Lymphs Abs: 1 10*3/uL (ref 0.7–4.0)
Monocytes Absolute: 0.6 10*3/uL (ref 0.1–1.0)
Monocytes Relative: 11 %
Neutro Abs: 3.8 10*3/uL (ref 1.7–7.7)
Neutrophils Relative %: 68 %

## 2019-08-30 LAB — RAPID URINE DRUG SCREEN, HOSP PERFORMED
Amphetamines: NOT DETECTED
Barbiturates: NOT DETECTED
Benzodiazepines: NOT DETECTED
Cocaine: NOT DETECTED
Opiates: NOT DETECTED
Tetrahydrocannabinol: NOT DETECTED

## 2019-08-30 LAB — CBG MONITORING, ED
Glucose-Capillary: 155 mg/dL — ABNORMAL HIGH (ref 70–99)
Glucose-Capillary: 163 mg/dL — ABNORMAL HIGH (ref 70–99)

## 2019-08-30 LAB — URINALYSIS, ROUTINE W REFLEX MICROSCOPIC
Bilirubin Urine: NEGATIVE
Glucose, UA: NEGATIVE mg/dL
Hgb urine dipstick: NEGATIVE
Ketones, ur: NEGATIVE mg/dL
Nitrite: NEGATIVE
Protein, ur: NEGATIVE mg/dL
Specific Gravity, Urine: 1.011 (ref 1.005–1.030)
pH: 5 (ref 5.0–8.0)

## 2019-08-30 LAB — LACTIC ACID, PLASMA: Lactic Acid, Venous: 2 mmol/L (ref 0.5–1.9)

## 2019-08-30 LAB — APTT: aPTT: 34 seconds (ref 24–36)

## 2019-08-30 LAB — ETHANOL: Alcohol, Ethyl (B): <10 mg/dL (ref <10)

## 2019-08-30 LAB — AMMONIA: Ammonia: 195 umol/L — ABNORMAL HIGH (ref 9–35)

## 2019-08-30 LAB — SARS CORONAVIRUS 2 BY RT PCR (HOSPITAL ORDER, PERFORMED IN ~~LOC~~ HOSPITAL LAB): SARS Coronavirus 2: NEGATIVE

## 2019-08-30 LAB — PROTIME-INR
INR: 1.6 — ABNORMAL HIGH (ref 0.8–1.2)
Prothrombin Time: 19.3 seconds — ABNORMAL HIGH (ref 11.4–15.2)

## 2019-08-30 MED ORDER — LACTULOSE 10 GM/15ML PO SOLN
30.0000 g | Freq: Four times a day (QID) | ORAL | Status: DC | PRN
  Filled 2019-08-30: qty 45

## 2019-08-30 MED ORDER — RIFAXIMIN 550 MG PO TABS
550.0000 mg | ORAL_TABLET | Freq: Two times a day (BID) | ORAL | Status: DC
  Administered 2019-08-30 – 2019-09-01 (×3): 550 mg
  Filled 2019-08-30 (×3): qty 1

## 2019-08-30 MED ORDER — INSULIN ASPART 100 UNIT/ML ~~LOC~~ SOLN
0.0000 [IU] | SUBCUTANEOUS | Status: DC
  Administered 2019-08-30 – 2019-08-31 (×2): 2 [IU] via SUBCUTANEOUS
  Administered 2019-08-31: 1 [IU] via SUBCUTANEOUS
  Administered 2019-08-31 (×5): 2 [IU] via SUBCUTANEOUS
  Administered 2019-09-01: 3 [IU] via SUBCUTANEOUS
  Administered 2019-09-01: 1 [IU] via SUBCUTANEOUS
  Administered 2019-09-01: 5 [IU] via SUBCUTANEOUS
  Administered 2019-09-01: 1 [IU] via SUBCUTANEOUS
  Administered 2019-09-01: 14:00:00 3 [IU] via SUBCUTANEOUS
  Administered 2019-09-02: 2 [IU] via SUBCUTANEOUS
  Administered 2019-09-02: 04:00:00 1 [IU] via SUBCUTANEOUS
  Administered 2019-09-02: 15:00:00 3 [IU] via SUBCUTANEOUS
  Administered 2019-09-02: 2 [IU] via SUBCUTANEOUS
  Administered 2019-09-02 (×2): 3 [IU] via SUBCUTANEOUS
  Administered 2019-09-03 (×2): 2 [IU] via SUBCUTANEOUS
  Administered 2019-09-03 (×3): 3 [IU] via SUBCUTANEOUS
  Administered 2019-09-03 – 2019-09-04 (×3): 2 [IU] via SUBCUTANEOUS
  Administered 2019-09-04: 01:00:00 3 [IU] via SUBCUTANEOUS
  Administered 2019-09-04: 2 [IU] via SUBCUTANEOUS

## 2019-08-30 MED ORDER — ACETAMINOPHEN 325 MG PO TABS: 650.0000 mg | ORAL_TABLET | Freq: Four times a day (QID) | ORAL | Status: DC | PRN

## 2019-08-30 MED ORDER — ACETAMINOPHEN 650 MG RE SUPP: 650.0000 mg | Freq: Four times a day (QID) | RECTAL | Status: DC | PRN

## 2019-08-30 MED ORDER — SODIUM CHLORIDE 0.9% FLUSH
3.0000 mL | Freq: Once | INTRAVENOUS | Status: AC
  Administered 2019-08-30: 3 mL via INTRAVENOUS

## 2019-08-30 MED ORDER — HYDRALAZINE HCL 20 MG/ML IJ SOLN
5.0000 mg | INTRAMUSCULAR | Status: DC | PRN
  Administered 2019-08-31 – 2019-09-02 (×3): 5 mg via INTRAVENOUS
  Filled 2019-08-30 (×3): qty 1

## 2019-08-30 MED ORDER — LACTULOSE 10 GM/15ML PO SOLN
30.0000 g | Freq: Once | ORAL | Status: AC
  Administered 2019-08-30: 30 g
  Filled 2019-08-30: qty 45

## 2019-08-30 MED ORDER — STERILE WATER FOR INJECTION IV SOLN
INTRAVENOUS | Status: DC
  Administered 2019-08-30: 22:00:00 via INTRAVENOUS
  Filled 2019-08-30 (×2): qty 850

## 2019-08-30 NOTE — ED Provider Notes (Signed)
[facility] EMERGENCY DEPARTMENT Provider Note   CSN: [id number] Arrival date & time: 08/30/19  1724  An emergency department physician performed an initial assessment on this suspected stroke patient at 1745.  History   Chief Complaint Chief Complaint  Patient presents with  . Altered Mental Status    HPI Dennis Mcneil is a 78 y.o. male with PMH/o DM, HTN BIB EMS who presents for evaluation of generalized weakness and confusion x 1 week that acutely worsened at around 3 PM this afternoon.  Per EMS, patient was on the commode and had an episode where he became unresponsive and was more confused.  After the ports, he was having difficulty speaking and wife called EMS.  Wife reports that at baseline, patient is alert and oriented x4 and is able to move and speak without any difficulty.  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  She states he has not missed any doses.  She does report that he was admitted in [place] for evaluation of subarachnoid hemorrhage in July and August and was discharged on 08/12/2019.  Does report at that time, he was not back to baseline.  She reports that over the last week, the confusion has become worse still it worsened significantly today.  Nurse checked ammonia levels but she does not know what it was.  No fevers at home.  EM LEVEL 5 CAVET     The history is provided by the EMS personnel and a relative.    Past Medical History:  Diagnosis Date  . Diabetes mellitus without complication ([place])   . Hypertension     Patient Active Problem List   Diagnosis Date Noted  . AMS (altered mental status) 08/30/2019    No past surgical history on file.      Home Medications    Prior to Admission medications   Medication Sig Start Date End Date Taking? Authorizing Provider  finasteride (PROSCAR) 5 MG tablet Take 5 mg by mouth daily.   Yes [provider]  furosemide (LASIX) 40 MG tablet Take 40 mg by  mouth 2 (two) times daily.   Yes [provider]  lactulose (CHRONULAC) 10 GM/15ML solution Take 20 g by mouth 3 (three) times daily.   Yes [provider]  losartan (COZAAR) 25 MG tablet Take 25 mg by mouth daily.   Yes [provider]  rifaximin (XIFAXAN) 550 MG TABS tablet Take 550 mg by mouth 2 (two) times daily.   Yes [provider]  spironolactone (ALDACTONE) 25 MG tablet Take 25 mg by mouth daily.   Yes [provider]    Family History History reviewed. No pertinent family history.  Social History Social History   Tobacco Use  . Smoking status: Never Smoker  Substance Use Topics  . Alcohol use: Yes  . Drug use: Not on file     Allergies   Vitamin b12   Review of Systems Review of Systems  Unable to perform ROS: Mental status change     Physical Exam Updated Vital Signs BP (!) 139/94   Pulse (!) 58   Temp 98.2 F (36.8 C) (Oral)   Resp 14   SpO2 99%   Physical Exam Vitals signs and nursing note reviewed.  Constitutional:      Appearance: Normal appearance. He is well-developed.  HENT:     Head: Normocephalic and atraumatic.  Eyes:     General: Lids are normal.  Conjunctiva/sclera: Conjunctivae normal.     Pupils: Pupils are equal, round, and reactive to light.     Comments: PERRL. Unable to assess EOMs as not following commands.   Neck:     Musculoskeletal: Full passive range of motion without pain.  Cardiovascular:     Rate and Rhythm: Normal rate and regular rhythm.     Pulses: Normal pulses.          Radial pulses are 2+ on the right side and 2+ on the left side.       Dorsalis pedis pulses are 2+ on the right side and 2+ on the left side.     Heart sounds: Normal heart sounds. No murmur. No friction rub. No gallop.   Pulmonary:     Effort: Pulmonary effort is normal.     Breath sounds: Normal breath sounds.     Comments: Lungs clear to auscultation bilaterally.  Symmetric chest rise.  No  wheezing, rales, rhonchi. Abdominal:     Palpations: Abdomen is soft. Abdomen is not rigid.     Tenderness: There is no abdominal tenderness. There is no guarding.     Comments: Abdomen is soft, non-distended, non-tender. No rigidity, No guarding. No peritoneal signs.  Musculoskeletal: Normal range of motion.  Skin:    General: Skin is warm and dry.     Capillary Refill: Capillary refill takes less than 2 seconds.  Neurological:     Mental Status: He is alert.     Comments: Patient is awake.  He is not following commands.  Mumbles. Unable to assess strength of bilateral upper and lower extremities as he does not follow commands. He will hold up his left upper extremity but the right one drops immediately.  Moving bilateral lower extremities spontaneously.  Withdraws to painful stimuli. Right-sided facial droop noted.  Psychiatric:        Speech: Speech normal.      ED Treatments / Results  Labs (all labs ordered are listed, but only abnormal results are displayed) Labs Reviewed  COMPREHENSIVE METABOLIC PANEL - Abnormal; Notable for the following components:      Result Value   Chloride 117 (*)    CO2 12 (*)    Glucose, Bld 127 (*)    BUN 51 (*)    Creatinine, Ser 4.82 (*)    Albumin 2.8 (*)    Total Bilirubin 2.1 (*)    GFR calc non Af Amer 11 (*)    GFR calc Af Amer 12 (*)    All other components within normal limits  CBC - Abnormal; Notable for the following components:   RBC 3.64 (*)    Hemoglobin 11.5 (*)    HCT 35.2 (*)    Platelets 61 (*)    All other components within normal limits  URINALYSIS, ROUTINE W REFLEX MICROSCOPIC - Abnormal; Notable for the following components:   Leukocytes,Ua TRACE (*)    Bacteria, UA RARE (*)    All other components within normal limits  AMMONIA - Abnormal; Notable for the following components:   Ammonia 195 (*)    All other components within normal limits  PROTIME-INR - Abnormal; Notable for the following components:    Prothrombin Time 19.3 (*)    INR 1.6 (*)    All other components within normal limits  LACTIC ACID, PLASMA - Abnormal; Notable for the following components:   Lactic Acid, Venous 2.0 (*)    All other components within normal limits  CBG MONITORING, ED -  Abnormal; Notable for the following components:   Glucose-Capillary 163 (*)    All other components within normal limits  SARS CORONAVIRUS 2 (HOSPITAL ORDER, [place] [facility])  ETHANOL  APTT  DIFFERENTIAL  RAPID URINE DRUG SCREEN, HOSP PERFORMED  CBC  BASIC METABOLIC PANEL  HEPATIC FUNCTION PANEL  I-STAT CHEM 8, ED    EKG EKG Interpretation  Date/Time:  Thursday August 30 2019 17:36:13 EDT Ventricular Rate:  57 PR Interval:    QRS Duration: 97 QT Interval:  481 QTC Calculation: 469 R Axis:   -32 Text Interpretation:  Sinus rhythm Inferior infarct, old No previous ECGs available Confirmed by Wandra Arthurs [phone]) on 08/30/2019 6:48:59 PM   Radiology Dg Abd Portable 1 View  Result Date: 08/30/2019 CLINICAL DATA:  NG tube placement EXAM: PORTABLE ABDOMEN - 1 VIEW COMPARISON:  06/25/2019 FINDINGS: Esophagogastric tube is positioned with tip and side port below the diaphragm, tip positioned over the gastric body. Nonobstructive pattern of partially imaged bowel gas. IMPRESSION: Esophagogastric tube is positioned with tip and side port below the diaphragm, tip positioned over the gastric body. Electronically Signed   By: Eddie Candle M.D.   On: 08/30/2019 20:27   Ct Head Code Stroke Wo Contrast  Result Date: 08/30/2019 CLINICAL DATA:  Code stroke.  Altered level of consciousness. EXAM: CT HEAD WITHOUT CONTRAST TECHNIQUE: Contiguous axial images were obtained from the base of the skull through the vertex without intravenous contrast. COMPARISON:  CT head 07/29/2019, 06/25/2019 FINDINGS: Brain: Low-density subdural hematoma on the left measures 13 mm in thickness on coronal images. This is mildly improved in size  since the prior study. High density blood products seen on the prior study in the subdural hematoma have resolved. No acute or new hemorrhage. 2 mm midline shift to the right unchanged. Ventricle size normal. Chronic microvascular ischemic changes in the white matter and left basal ganglia stable. No acute infarct or mass. Vascular: Atherosclerotic calcification distal vertebral arteries and bilateral carotid arteries. Skull: Negative Sinuses/Orbits: Mild mucosal edema paranasal sinuses.  Normal orbit. Other: None ASPECTS ([place] Stroke Program Early CT Score) - Ganglionic level infarction (caudate, lentiform nuclei, internal capsule, insula, M1-M3 cortex): 7 - Supraganglionic infarction (M4-M6 cortex): 3 Total score (0-10 with 10 being normal): 10 IMPRESSION: 1. 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 No new area of hemorrhage. 2. No acute ischemic infarct. 3. ASPECTS is 10 4. These results were called by telephone at the time of interpretation on 08/30/2019 at 6:01 pm to provider Rory Percy , who verbally acknowledged these results. Electronically Signed   By: Franchot Gallo M.D.   On: 08/30/2019 18:02    Procedures Procedures (including critical care time)  Medications Ordered in ED Medications  lactulose (CHRONULAC) 10 GM/15ML solution 30 g (has no administration in time range)  acetaminophen (TYLENOL) tablet 650 mg (has no administration in time range)    Or  acetaminophen (TYLENOL) suppository 650 mg (has no administration in time range)  insulin aspart (novoLOG) injection 0-9 Units (2 Units Subcutaneous Given 08/30/19 2214)  hydrALAZINE (APRESOLINE) injection 5 mg (has no administration in time range)  sodium bicarbonate 150 mEq in sterile water 1,000 mL infusion ( Intravenous New Bag/Given 08/30/19 2214)  rifaximin (XIFAXAN) tablet 550 mg (550 mg Per Tube Given 08/30/19 2213)  sodium chloride flush (NS) 0.9 % injection 3  mL (3 mLs Intravenous Given 08/30/19 1832)  lactulose ([place]) 10  GM/15ML solution 30 g (30 g Per Tube Given 08/30/19 2051)     Initial Impression / Assessment and Plan / ED Course  I have reviewed the triage vital signs and the nursing notes.  Pertinent labs & imaging results that were available during my care of the patient were reviewed by me and considered in my medical decision making (see chart for details).        78 year old male past medical history of subarachnoid hemorrhage, encephalopathy, brought in by EMS for worsening confusion.  On initial ED arrival, he is afebrile, slightly hypertensive.  Vitals otherwise stable.  On exam, he does have some right-sided weakness.  Question if this is worsening.  On initial arrival, EMS reported that he worsened acutely at 3 PM.  Given concerns of possible acute stroke, code stroke was initiated.  Discussed with Dr. Alanson Puls (Neuro).  Will come evaluate patient.  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  Given subdural hematoma, he is not a candidate for TPA.  Discussed with neuro.  Will cancel code stroke.  Patient should be admitted for MRI and EEG.  UA shows trace leukocytes but otherwise reassuring.  UDS is negative.  Lactic is 2.0.  Suspect this most likely from dehydration.  Ammonia is elevated at 195.  NG tube placed and will give lactulose here in the ED.  Ethanol unremarkable.  CMP shows BUN of 51, creatinine of 4.82.  CBC shows no leukocytosis.  Hemoglobin stable at 11.5.  Given encephalopathy with ammonia elevated, will plan for lactulose.  NG tube placed for lactulose.  Discussed with hospitalist.  Would like me to consult neurosurgery to see if patient is stable for staying here in [facility] facility.  Discussed with Al Pimple (Neurosurg). He will consult patient and neurosurg will be available for consult. Updated hospitalist on plan.    Portions of this note were generated with Lobby[employer]. Dictation errors may occur despite best attempts at proofreading.  Final Clinical Impressions(s) / ED Diagnoses   Final diagnoses:  Encephalopathy  AKI (acute kidney injury) [facility])    ED Discharge Orders    None       Desma Mcgregor 08/30/19 2242    Drenda Freeze, MD 08/30/19 [phone]

## 2019-08-30 NOTE — Consult Note (Signed)
Neurology Consultation  Reason for Consult: Code stroke for altered mental status Referring Physician: Dr. Shirlyn Goltz  CC: Altered mental status over 1 week with sudden worsening 3 PM  History is obtained from: Wife, chart review  HPI: Dennis Mcneil is a 78 y.o. male veteran, past medical history of hypertension, diabetes, cirrhosis, left sided subdural hematoma with recent admission at [facility] in August, presented for worsening mentation that has been going on for 1 week with becoming more so sleepy today at 3 PM. The wife says that ever since his discharge at the end of August from [place], he has been off and on lethargic but that was attributed to his ammonia levels.  He was getting frequent level checks for ammonia and ammonia was high.  He was being treated with lactulose.  He continued to be altered.  Today at 3 PM he was more altered than usual and that is when EMS was called and brought him in.  Due to symptoms being persistent for about a week, EMS did not call a code stroke but due to the ED provider receiving information that last known normal was 3 PM, code stroke was activated. He also exhibited right-sided weakness and right facial droop along with depressed level of consciousness which prompted them to call a code stroke. The wife says that the subdural is very old.  The notes from [place] say that the subdural expanded during the last admission and he was more somnolent than would be expected and underwent multiple EEGs and continuous EEG without any evidence of seizures.  Keppra was on the discharge list of his medications but the wife says it was stopped by someone from home care speaking with the primary care and primary care making the decision to stop it.  The wife is very upset at the care that he had received over at [facility] and the plan of treatment at [facility].  The bottom line is that he has been having a steady and consistent decline in his cognition and memory now for over 3 years with a  subdural hematoma which has been increasing in size.  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  Ever since his discharge about 2 weeks ago from [place], he has not walk much and whenever he does he has required walker or wheelchair.  No recent illnesses sicknesses.  No fevers chills.  Continuing lethargy with off-and-on waxing and waning mental status.  No chest pain shortness of breath nausea vomiting abdominal pain diarrhea constipation.   LKW: 1 to 2 weeks ago tpa given?: no, outside window, subdural hematoma Premorbid modified Rankin scale (mRS): 4  ROS unable to perform due to patient's mentation  Past Medical History:  Diagnosis Date  . Diabetes mellitus without complication ([place])   . Hypertension    No family history on file. No family history of strokes  Social History:   reports that he has never smoked. He does not have any smokeless tobacco history on file. He reports current alcohol use. No history on file for drug. Wife reports that he never smoked, no drug history.  Used to drink alcohol occasionally but has not had a drink in 15 years  Medications  Current Facility-Administered Medications:  .  sodium chloride flush (NS) 0.9 % injection 3 mL, 3 mL, Intravenous, Once, Layden, Lindsey A, PA-C No current outpatient medications on file.  Outpatient medications-need reconciliation but include lactulose, antihypertensives and hypoglycemics.  Exam: Current vital signs: BP Marland Kitchen)  163/86   Temp 98.2 F (36.8 C) (Oral)   Resp (!) 28  Vital signs in last 24 hours: Temp:  [98.2 F (36.8 C)] 98.2 F (36.8 C) (09/10 1735) Resp:  [14-28] 28 (09/10 1830) BP: (145-163)/(86-87) 163/86 (09/10 1830)  GENERAL: Drowsy, in no distress HEENT: - Normocephalic and atraumatic, dry mm, no LN++, no Thyromegally LUNGS - Clear to auscultation bilaterally with no wheezes CV - S1S2 RRR, no m/r/g, equal pulses bilaterally. ABDOMEN - Soft, nontender,  nondistended with normoactive BS Ext: Warm, trace edema  NEURO:  Mental Status: Drowsy, does not open eyes to voice, does not meaningfully follow commands. Language: speech is severely dysarthric.could not check naming comprehension or repetition as he does not follow commands and is very lethargic and only mumbles. Cranial Nerves: PERRL. EOMI, right lower facial weakness, does not blink to threat from any side. Motor: Right upper and lower extremity are barely antigravity inconsistently.  Left upper extremity is at least 4+/5.  Left lower extremity is also a 4/5. Tone: is normal and bulk is normal Sensation- withdraws both extremities briskly to pain-weaker on the right.  Withdraws both upper extremities to pain with significant weakness on the right compared to left Coordination: Unable to test due to mentation Gait- deferred due to mentation  NIHSS 1a Level of Conscious.: 1 1b LOC Questions: 2 1c LOC Commands: 2 2 Best Gaze: 0 3 Visual: 0 4 Facial Palsy: 2 5a Motor Arm - left: 0 5b Motor Arm - Right: 1 6a Motor Leg - Left: 2 6b Motor Leg - Right: 2 7 Limb Ataxia: 0 8 Sensory: 1 9 Best Language: 2 10 Dysarthria: 2 11 Extinct. and Inatten.: 0 TOTAL: 17    Labs I have reviewed labs in epic and the results pertinent to this consultation are: He is a very hard stick.  Labs were drawn from an EJ.  Pending at this time.  Will refresh when available  CBC No results found for: WBC, RBC, HGB, HCT, PLT, MCV, MCH, MCHC, RDW, LYMPHSABS, MONOABS, EOSABS, BASOSABS  CMP  No results found for: NA, K, CL, CO2, GLUCOSE, BUN, CREATININE, CALCIUM, PROT, ALBUMIN, AST, ALT, ALKPHOS, BILITOT, GFRNONAA, GFRAA  Lipid Panel  No results found for: CHOL, TRIG, HDL, CHOLHDL, VLDL, LDLCALC, LDLDIRECT   Imaging I have reviewed the images obtained:  CT-scan of the brain-left subdural-late subacute to chronic-I do not have access to comparison images but according to radiologist smaller than prior  study of 07/29/2019 and 06/25/2019.  Assessment: 78 year old man past history of hypertension diabetes cirrhosis left-sided subdural hematoma with recent admission with hematoma and August at [facility], with worsening mentation over past 1 week. Symptoms likely secondary to either toxic metabolic encephalopathy from hyperammonemia or other differentials include seizure activity from the subdural. He has waxing waning mentation.  Multiple spot EEG is on LTM EEG is at [facility] have been negative for seizures. I suspect that this is mostly related to the metabolic derangements.  Impression: Hepatic encephalopathy Toxic metabolic encephalopathy Evaluate for seizures Left-sided subdural hematoma  Recommendations: Hold off on antiepileptics Routine EEG in the morning Admit to stepdown for close neuro monitoring MRI brain without contrast when able to Await labs and correct hyperammonemia if ammonia is elevated. Correct any other toxic metabolic derangements are found. Check for any evidence of UTI or other infection. Due to the subacute/chronic subdural-blood pressure goal less than 160. We will follow with you.  Code stroke can be canceled as this patient has been having symptoms now  for over a week  Plan was discussed with PA L. Layden in the ER. -- Amie Portland, MD [facility] Pager: [phone] If 7pm to 7am, please call on call as listed on AMION.

## 2019-08-30 NOTE — H&P (Signed)
History and Physical    Dennis Mcneil [id number] DOB: August 19, 1941 DOA: 08/30/2019  PCP: Patient, No Pcp Per Patient coming from: Home  Chief Complaint: Altered mental status, unresponsiveness  HPI: Dennis Mcneil is a 78 y.o. 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  Per triage note, wife called EMS stating that he has been confused for the past week but today around 1545 became unresponsive while on bedside commode at home.  He did not fall or injure himself.  He was having difficulty speaking and wife called EMS.  Vital signs stable per EMS.  In the ED, patient was noted to have right-sided weakness.  Code stroke activated.  Neurology consulted.  Patient is disoriented.  No history could be obtained from him.  I called and spoke to the patient's wife over the phone.  Stated patient has had multiple hospital admissions since June at [facility], [place], and has been seen at [facility] ED since then.  She is very upset about the care she received at these facilities.  States she was initially told that the patient had a bleed in his brain back in 2018 but then again told that he had a bleed in his brain back in July at [place].  States since patient has been discharged from [facility] he has not been doing well.  He has been sleeping a lot and is very tired.  Last night he became disoriented and could not find his way around the house.  States this usually happens when his ammonia level is high.  He was diagnosed with liver cirrhosis 3 years ago.  Wife states she has been very particular about giving him lactulose in a timely fashion.  She gives it to him every 5 hours but it seems  it is not helping.  She has also been giving him rifaximin as prescribed.  Denies any fevers, cough, or shortness of breath.  Patient has not vomited.  He has not complained of any abdominal pain.  States patient is DNR.  ED Course: Afebrile.  White count 5.6, hemoglobin 11.5, platelet count 61.  Bicarb 12, anion gap 10.  BUN 51, creatinine 4.8.  T bili 2.1, remainder of LFTs normal.  INR 1.6.  Ammonia level 195.  Blood ethanol level negative.  UDS negative.  Lactic acid 2.0.  UA with negative nitrite, trace amount of leukocytes, 0-5 WBCs, and rare bacteria on microscopic examination.  SARS-CoV-2 test pending.  Head CT showing 13 mm thickness late subacute to chronic subdural hematoma on the left which is slightly smaller compared to the prior study.  No new area of hemorrhage.  No acute ischemic infarct.  Neurology consulted. Given subdural hematoma he is not a candidate for TPA.  Code stroke canceled.  Admission requested for MRI and EEG. Patient received lactulose per NG tube in the ED.  Review of Systems:  All systems reviewed and apart from history of presenting illness, are negative.  Past Medical History:  Diagnosis Date   Diabetes mellitus without complication ([facility])    Hypertension     No past surgical history on file.   reports that he has never smoked. He does not have any smokeless tobacco history on file. He reports current alcohol use.  No history on file for drug.  Allergies  Allergen Reactions   Vitamin B12 Hives    History reviewed. No pertinent family history.  Prior to Admission medications   Not on File    Physical Exam: Vitals:   08/30/19 2145 08/30/19 2245 08/30/19 2300 08/31/19 0125  BP: (!) 139/94 (!) 167/95 (!) 153/93 (!) 164/91  Pulse: (!) 58  (!) 58 (!) 58  Resp: 14 12 12 14   Temp:    98.6 F (37 C)  TempSrc:    Axillary  SpO2: 99%  100% 100%    Physical Exam  Constitutional: No distress.  HENT:  Head: Normocephalic.  NG tube in place  Eyes:    Unable to assess pupillary reaction to light due to lack of patient cooperation  Neck: Neck supple.  Cardiovascular: Normal rate, regular rhythm and intact distal pulses.  Pulmonary/Chest: Effort normal. No respiratory distress.  Anterior lung fields clear to auscultation  Abdominal: Soft. Bowel sounds are normal. He exhibits no distension. There is no abdominal tenderness. There is no guarding.  Musculoskeletal:        General: No edema.  Neurological:  Confused, disoriented Able to make a fist and squeeze my fingers on command bilaterally and able to wiggle his toes on command bilaterally  Skin: Skin is warm and dry. He is not diaphoretic.     Labs on Admission: I have personally reviewed following labs and imaging studies  CBC: Recent Labs  Lab 08/30/19 1734  WBC 5.6  NEUTROABS 3.8  HGB 11.5*  HCT 35.2*  MCV 96.7  PLT 61*   Basic Metabolic Panel: Recent Labs  Lab 08/30/19 1734  NA 139  K 4.0  CL 117*  CO2 12*  GLUCOSE 127*  BUN 51*  CREATININE 4.82*  CALCIUM 9.3   GFR: CrCl cannot be calculated (Unknown ideal weight.). Liver Function Tests: Recent Labs  Lab 08/30/19 1734  AST 21  ALT 23  ALKPHOS 113  BILITOT 2.1*  PROT 7.2  ALBUMIN 2.8*   No results for input(s): LIPASE, AMYLASE in the last 168 hours. Recent Labs  Lab 08/30/19 1735  AMMONIA 195*   Coagulation Profile: Recent Labs  Lab 08/30/19 1734  INR 1.6*   Cardiac Enzymes: No results for input(s): CKTOTAL, CKMB, CKMBINDEX, TROPONINI in the last 168 hours. BNP (last 3 results) No results for input(s): PROBNP in the last 8760 hours. HbA1C: No results for input(s): HGBA1C in the last 72 hours. CBG: Recent Labs  Lab 08/30/19 2150 08/30/19 2358 08/31/19 0145 08/31/19 0424  GLUCAP 163* 155* 139* 136*   Lipid Profile: No results for input(s): CHOL, HDL, LDLCALC, TRIG, CHOLHDL, LDLDIRECT in the last 72 hours. Thyroid Function Tests: No results for input(s): TSH, T4TOTAL, FREET4,  T3FREE, THYROIDAB in the last 72 hours. Anemia Panel: No results for input(s): VITAMINB12, FOLATE, FERRITIN, TIBC, IRON, RETICCTPCT in the last 72 hours. Urine analysis:    Component Value Date/Time   COLORURINE YELLOW 08/30/2019 1820   APPEARANCEUR CLEAR 08/30/2019 1820   LABSPEC 1.011 08/30/2019 1820   PHURINE 5.0 08/30/2019 1820   GLUCOSEU NEGATIVE 08/30/2019 1820   HGBUR NEGATIVE 08/30/2019 1820   BILIRUBINUR NEGATIVE 08/30/2019 1820   KETONESUR NEGATIVE 08/30/2019 1820   PROTEINUR NEGATIVE 08/30/2019 1820   NITRITE NEGATIVE 08/30/2019 1820   LEUKOCYTESUR TRACE (A) 08/30/2019 1820    Radiological Exams on Admission: Dg Chest Portable 1 View  Result Date: 08/31/2019 CLINICAL DATA:  MRI clearance EXAM: PORTABLE CHEST 1 VIEW COMPARISON:  None. FINDINGS:  NG tube enters the stomach. Cardiomegaly with vascular congestion. No confluent opacities, effusions or edema. No radiopaque foreign bodies. No pacer or other cardiac device seen. IMPRESSION: Cardiomegaly, vascular congestion. Electronically Signed   By: Rolm Baptise M.D.   On: 08/31/2019 00:22   Dg Abd Portable 1 View  Result Date: 08/30/2019 CLINICAL DATA:  NG tube placement EXAM: PORTABLE ABDOMEN - 1 VIEW COMPARISON:  06/25/2019 FINDINGS: Esophagogastric tube is positioned with tip and side port below the diaphragm, tip positioned over the gastric body. Nonobstructive pattern of partially imaged bowel gas. IMPRESSION: Esophagogastric tube is positioned with tip and side port below the diaphragm, tip positioned over the gastric body. Electronically Signed   By: Eddie Candle M.D.   On: 08/30/2019 20:27   Ct Head Code Stroke Wo Contrast  Result Date: 08/30/2019 CLINICAL DATA:  Code stroke.  Altered level of consciousness. EXAM: CT HEAD WITHOUT CONTRAST TECHNIQUE: Contiguous axial images were obtained from the base of the skull through the vertex without intravenous contrast. COMPARISON:  CT head 07/29/2019, 06/25/2019 FINDINGS: Brain:  Low-density subdural hematoma on the left measures 13 mm in thickness on coronal images. This is mildly improved in size since the prior study. High density blood products seen on the prior study in the subdural hematoma have resolved. No acute or new hemorrhage. 2 mm midline shift to the right unchanged. Ventricle size normal. Chronic microvascular ischemic changes in the white matter and left basal ganglia stable. No acute infarct or mass. Vascular: Atherosclerotic calcification distal vertebral arteries and bilateral carotid arteries. Skull: Negative Sinuses/Orbits: Mild mucosal edema paranasal sinuses.  Normal orbit. Other: None ASPECTS ([place] Stroke Program Early CT Score) - Ganglionic level infarction (caudate, lentiform nuclei, internal capsule, insula, M1-M3 cortex): 7 - Supraganglionic infarction (M4-M6 cortex): 3 Total score (0-10 with 10 being normal): 10 IMPRESSION: 1. 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 No new area of hemorrhage. 2. No acute ischemic infarct. 3. ASPECTS is 10 4. These results were called by telephone at the time of interpretation on 08/30/2019 at 6:01 pm to provider Rory Percy , who verbally acknowledged these results. Electronically Signed   By: Franchot Gallo M.D.   On: 08/30/2019 18:02    EKG: Independently reviewed.  Sinus rhythm, heart rate 57.  No prior EKGs for comparison.  Assessment/Plan Principal Problem:   AMS (altered mental status) Active Problems:   Liver cirrhosis (HCC)   Acute hepatic encephalopathy   Subdural hematoma (HCC)   AKI (acute kidney injury) (HCC)   Metabolic acidosis   Altered mental status Suspect due to hepatic encephalopathy given elevated ammonia level and possible seizure activity from subdural hematoma.  Patient continues to be disoriented.  EEG showing epileptiform discharges not evolving to electrographic seizures. -Monitor closely in the progressive care  unit -Frequent neurochecks -IV Dilantin 1500 mg once, then IV Dilantin 100 mg every 8 hours per neurology -Check Dilantin level tomorrow morning -Seizure precautions -MRI brain without contrast -Lactulose for hepatic encephalopathy -Frequent neurochecks -Neurology following, appreciate recommendations  Decompensated liver cirrhosis with acute hepatic encephalopathy Per [facility] records, cirrhosis of unclear etiology, suspected EtOH versus NASH. Ammonia level elevated.  T bili 2.1, remainder of LFTs normal.  LFTs were normal on 8/16 under care everywhere.  Abdominal exam benign. -Continue lactulose and rifaximin -Continue to monitor ammonia level -Right upper quadrant ultrasound -Continue to monitor LFTs  Subacute-chronic left subdural hematoma Appears slightly smaller on current CT.  No new area of hemorrhage. -Seen by neurosurgery.  No indication for subdural evacuation at present. -Frequent neurochecks -Hold antiplatelet agents/anticoagulation -Goal systolic blood pressure less than 160  Normal anion gap metabolic acidosis Bicarb 12, anion gap 10.  Likely related to acute on chronic kidney injury.  Bicarb was previously around 18 back in August. -Initial x-ray without vascular congestion.  Patient was started on bicarb infusion.  Infusion has been stopped as repeat x-ray done in the ED showing signs of vascular congestion. -Morning BMP pending.  Continue to monitor bicarbonate level and replete with IV push if needed.  AKI on CKD stage 4 BUN 51, creatinine 4.8.  Recent creatinine 2.9-3.0 back in August. -IV fluid has been stopped given signs of vascular congestion on repeat chest x-ray. -Morning BMP pending, continue to monitor renal function -Monitor urine output -Renal ultrasound -Avoid nephrotoxic agents/contrast  Anemia -Stable.  Hemoglobin 11.5, was 8.4 on 08/10/2019. -Continue to monitor  Chronic thrombocytopenia -Stable.  Platelet count 61,000.  Chronically in the  40-50,000 range.  Subdural hematoma appears slightly smaller on current CT.  No new area of hemorrhage. -Continue to monitor  Volume overload No documented history of CHF.  Repeat chest x-ray showing cardiomegaly with vascular congestion.  No overt pulmonary edema.  Not hypoxic and no signs of respiratory distress. -Unable to diurese given AKI -Echocardiogram  History of dysphagia -N.p.o. -SLP eval  Insulin-dependent diabetes mellitus -Last A1c 7.3 on 7/7 per [facility] records.  Check A1c.  Sliding scale insulin and CBG checks.  Pharmacy med rec pending.  DVT prophylaxis: SCDs Code Status: DNR.  Confirmed with the patient's wife over the phone. Family Communication: Wife updated over the phone. Disposition Plan: Anticipate discharge after clinical improvement. Consults called: None Admission status: It is my clinical opinion that admission to INPATIENT is reasonable and necessary in this 78 y.o. male  presenting with symptoms of AMS, concerning for hepatic encephalopathy and possible seizure activity from subdural hematoma.  Treatment plan mentioned above.  Patient continues to be disoriented.  Needs close monitoring and work-up mentioned above.   Given the aforementioned, the predictability of an adverse outcome is felt to be significant. I expect that the patient will require at least 2 midnights in the hospital to treat this condition.   The medical decision making on this patient was of high complexity and the patient is at high risk for clinical deterioration, therefore this is a level 3 visit.  Shela Leff MD [facility] Pager [phone]  If 7PM-7AM, please contact night-coverage [email or web address] Password [facility]  08/31/2019, 4:48 AM

## 2019-08-30 NOTE — Consult Note (Signed)
Chief Complaint   Chief Complaint  Patient presents with   Altered Mental Status    HPI   Consult requested by: PA Layden, EDP Reason for consult: Chronic SDH  HPI: Dennis Mcneil is a 78 y.o. male with history DM, CKD stage 3, HTN, hyperlipidemia, liver cirrhosis, left SDH (managed by [facility] July 2020), who presented to ED via EMS for evaluation of altered mental status. Patient is disoriented and unable to provide any history. By report, patient has been confused for the last week but today around 1545 became "unresponsive" without fall/injury. Due to this, wife called EMS. He has been admitted multiple times over the last 3 months for hepatic encephalopathy and fall resulting in left SDH. SDH treated nonoperatively. As part of work up for AMS today, he underwent a head CT which revealed the known SDH, although now subacute-chronic. NSY consultation requested.   Patient Active Problem List   Diagnosis Date Noted   AMS (altered mental status) 08/30/2019    PMH: Past Medical History:  Diagnosis Date   Diabetes mellitus without complication ([place])    Hypertension     PSH: No past surgical history on file.  (Not in a hospital admission)   SH: Social History   Tobacco Use   Smoking status: Never Smoker  Substance Use Topics   Alcohol use: Yes   Drug use: Not on file    MEDS: Prior to Admission medications   Medication Sig Start Date End Date Taking? Authorizing Provider  finasteride (PROSCAR) 5 MG tablet Take 5 mg by mouth daily.   Yes [provider]  furosemide (LASIX) 40 MG tablet Take 40 mg by mouth 2 (two) times daily.   Yes [provider]  lactulose (CHRONULAC) 10 GM/15ML solution Take 20 g by mouth 3 (three) times daily.   Yes [provider]  losartan (COZAAR) 25 MG tablet Take 25 mg by mouth daily.   Yes [provider]  rifaximin (XIFAXAN) 550 MG TABS tablet Take 550 mg by mouth 2 (two) times daily.   Yes [provider]  spironolactone (ALDACTONE) 25 MG tablet Take 25 mg by mouth daily.   Yes [provider]    ALLERGY: Allergies  Allergen Reactions   Vitamin B12 Hives    Social History   Tobacco Use   Smoking status: Never Smoker  Substance Use Topics   Alcohol use: Yes     History reviewed. No pertinent family history.   ROS   ROS AMS  Exam   Vitals:   08/30/19 2115 08/30/19 2130  BP: (!) 153/96 (!) 160/94  Pulse:    Resp: 10 14  Temp:    SpO2:     elderly male, resting comfortably, eyes closed PERRL Oriented to self, but not location/year Does not answer other questions with exception of saying "yes" to everything Right lower facial weakness Does not follow commands Does move extremities to painful stimulus BLE>BUE  Results - Imaging/Labs   Results for orders placed or performed during the hospital encounter of 08/30/19 (from the past 48 hour(s))  Comprehensive metabolic panel     Status: Abnormal   Collection Time: 08/30/19  5:34 PM  Result Value Ref Range   Sodium 139 135 - 145 mmol/L   Potassium 4.0 3.5 - 5.1 mmol/L   Chloride 117 (H) 98 - 111 mmol/L   CO2 12 (L) 22 - 32 mmol/L   Glucose, Bld 127 (H) 70 - 99 mg/dL   BUN 51 (H)  8 - 23 mg/dL   Creatinine, Ser 4.82 (H) 0.61 - 1.24 mg/dL   Calcium 9.3 8.9 - 10.3 mg/dL   Total Protein 7.2 6.5 - 8.1 g/dL   Albumin 2.8 (L) 3.5 - 5.0 g/dL   AST 21 15 - 41 U/L   ALT 23 0 - 44 U/L   Alkaline Phosphatase 113 38 - 126 U/L   Total Bilirubin 2.1 (H) 0.3 - 1.2 mg/dL   GFR calc non Af Amer 11 (L) >60 mL/min   GFR calc Af Amer 12 (L) >60 mL/min   Anion gap 10 5 - 15    Comment: Performed at [facility], [place] [address], [place], [place] [address]  CBC     Status: Abnormal   Collection Time: 08/30/19  5:34 PM  Result Value Ref Range   WBC 5.6 4.0 - 10.5 K/uL   RBC 3.64 (L) 4.22 - 5.81 MIL/uL   Hemoglobin 11.5 (L) 13.0 - 17.0 g/dL   HCT 35.2 (L) 39.0 - 52.0 %   MCV 96.7 80.0 - 100.0 fL    MCH 31.6 26.0 - 34.0 pg   MCHC 32.7 30.0 - 36.0 g/dL   RDW 15.5 11.5 - 15.5 %   Platelets 61 (L) 150 - 400 K/uL    Comment: REPEATED TO VERIFY PLATELET COUNT CONFIRMED BY SMEAR Immature Platelet Fraction may be clinically indicated, consider ordering this additional test [id number]    nRBC 0.0 0.0 - 0.2 %    Comment: Performed at [facility], [place] [address], [place], [place] [address]  Protime-INR     Status: Abnormal   Collection Time: 08/30/19  5:34 PM  Result Value Ref Range   Prothrombin Time 19.3 (H) 11.4 - 15.2 seconds   INR 1.6 (H) 0.8 - 1.2    Comment: (NOTE) INR goal varies based on device and disease states. Performed at [facility], [place] [address] [place], [place] [address]   APTT     Status: None   Collection Time: 08/30/19  5:34 PM  Result Value Ref Range   aPTT 34 24 - 36 seconds    Comment: Performed at [place] [address] [place], [place] [address]  Differential     Status: None   Collection Time: 08/30/19  5:34 PM  Result Value Ref Range   Neutrophils Relative % 68 %   Neutro Abs 3.8 1.7 - 7.7 K/uL   Lymphocytes Relative 17 %   Lymphs Abs 1.0 0.7 - 4.0 K/uL   Monocytes Relative 11 %   Monocytes Absolute 0.6 0.1 - 1.0 K/uL   Eosinophils Relative 3 %   Eosinophils Absolute 0.2 0.0 - 0.5 K/uL   Basophils Relative 0 %   Basophils Absolute 0.0 0.0 - 0.1 K/uL   Immature Granulocytes 1 %   Abs Immature Granulocytes 0.03 0.00 - 0.07 K/uL    Comment: Performed at [facility], [place] [address], [place], [place] [address]  Ammonia     Status: Abnormal   Collection Time: 08/30/19  5:35 PM  Result Value Ref Range   Ammonia 195 (H) 9 - 35 umol/L    Comment: Performed at [facility], [place] [address] [place], [place] [address]  Ethanol     Status: None   Collection Time: 08/30/19  5:35 PM  Result Value Ref Range   Alcohol, Ethyl (B) <10 <10 mg/dL    Comment: (NOTE) Lowest detectable limit for serum alcohol  is 10  mg/dL. For medical purposes only. Performed at [facility], [place] [address] [place], [place] [address]   Lactic acid, plasma     Status: Abnormal   Collection Time: 08/30/19  5:37 PM  Result Value Ref Range   Lactic Acid, Venous 2.0 (HH) 0.5 - 1.9 mmol/L    Comment: CRITICAL RESULT CALLED TO, READ BACK BY AND VERIFIED WITH:  A OLEARY,RN 1907 08/30/2019 WBOND  Performed at [facility], [place] [address], [place], [place] [address]   Urinalysis, Routine w reflex microscopic     Status: Abnormal   Collection Time: 08/30/19  6:20 PM  Result Value Ref Range   Color, Urine YELLOW YELLOW   APPearance CLEAR CLEAR   Specific Gravity, Urine 1.011 1.005 - 1.030   pH 5.0 5.0 - 8.0   Glucose, UA NEGATIVE NEGATIVE mg/dL   Hgb urine dipstick NEGATIVE NEGATIVE   Bilirubin Urine NEGATIVE NEGATIVE   Ketones, ur NEGATIVE NEGATIVE mg/dL   Protein, ur NEGATIVE NEGATIVE mg/dL   Nitrite NEGATIVE NEGATIVE   Leukocytes,Ua TRACE (A) NEGATIVE   RBC / HPF 0-5 0 - 5 RBC/hpf   WBC, UA 0-5 0 - 5 WBC/hpf   Bacteria, UA RARE (A) NONE SEEN   Squamous Epithelial / LPF 0-5 0 - 5   Hyaline Casts, UA PRESENT     Comment: Performed at [facility], [address], [place], [place] [address]  Urine rapid drug screen (hosp performed)     Status: None   Collection Time: 08/30/19  6:20 PM  Result Value Ref Range   Opiates NONE DETECTED NONE DETECTED   Cocaine NONE DETECTED NONE DETECTED   Benzodiazepines NONE DETECTED NONE DETECTED   Amphetamines NONE DETECTED NONE DETECTED   Tetrahydrocannabinol NONE DETECTED NONE DETECTED   Barbiturates NONE DETECTED NONE DETECTED    Comment: (NOTE) DRUG SCREEN FOR MEDICAL PURPOSES ONLY.  IF CONFIRMATION IS NEEDED FOR ANY PURPOSE, NOTIFY LAB WITHIN 5 DAYS. LOWEST DETECTABLE LIMITS FOR URINE DRUG SCREEN Drug Class                     Cutoff (ng/mL) Amphetamine and metabolites    1000 Barbiturate and metabolites    200 Benzodiazepine                  [id number] Tricyclics and metabolites     300 Opiates and metabolites        300 Cocaine and metabolites        300 THC                            50 Performed at [facility], [place] [address], [place], [place] [phone]     Dg Abd Portable 1 View  Result Date: 08/30/2019 CLINICAL DATA:  NG tube placement EXAM: PORTABLE ABDOMEN - 1 VIEW COMPARISON:  06/25/2019 FINDINGS: Esophagogastric tube is positioned with tip and side port below the diaphragm, tip positioned over the gastric body. Nonobstructive pattern of partially imaged bowel gas. IMPRESSION: Esophagogastric tube is positioned with tip and side port below the diaphragm, tip positioned over the gastric body. Electronically Signed   By: Eddie Candle M.D.   On: 08/30/2019 20:27   Ct Head Code Stroke Wo Contrast  Result Date: 08/30/2019 CLINICAL DATA:  Code stroke.  Altered level of consciousness. EXAM: CT HEAD WITHOUT CONTRAST TECHNIQUE: Contiguous axial images were obtained from the base  of the skull through the vertex without intravenous contrast. COMPARISON:  CT head 07/29/2019, 06/25/2019 FINDINGS: Brain: Low-density subdural hematoma on the left measures 13 mm in thickness on coronal images. This is mildly improved in size since the prior study. High density blood products seen on the prior study in the subdural hematoma have resolved. No acute or new hemorrhage. 2 mm midline shift to the right unchanged. Ventricle size normal. Chronic microvascular ischemic changes in the white matter and left basal ganglia stable. No acute infarct or mass. Vascular: Atherosclerotic calcification distal vertebral arteries and bilateral carotid arteries. Skull: Negative Sinuses/Orbits: Mild mucosal edema paranasal sinuses.  Normal orbit. Other: None ASPECTS ([place] Stroke Program Early CT Score) - Ganglionic level infarction (caudate, lentiform nuclei, internal capsule, insula, M1-M3 cortex): 7 - Supraganglionic infarction (M4-M6 cortex): 3 Total score (0-10  with 10 being normal): 10 IMPRESSION: 1. 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 No new area of hemorrhage. 2. No acute ischemic infarct. 3. ASPECTS is 10 4. These results were called by telephone at the time of interpretation on 08/30/2019 at 6:01 pm to provider Rory Percy , who verbally acknowledged these results. Electronically Signed   By: Franchot Gallo M.D.   On: 08/30/2019 18:02    Impression/Plan   78 y.o. male with altered mental status. CT head does show known subacute-chronic left SDH with minimal MLS. Question if symptoms are related to hepatic encephalopathy with elevated ammonia vs. Seizure from SDH. Do not see any indication for subdural evacuation at present. Would continue treatment and management per neuro and TH.  Ferne Reus, PA-C [facility] [employer]

## 2019-08-30 NOTE — Procedures (Addendum)
Patient Name: Dennis Mcneil  MRN: [id number]  Epilepsy Attending: Lora Havens  Referring Physician/Provider: Dr Amie Portland Date: 08/30/2019 Duration: 21.53 mins  Patient history: 78yo M with left SDH and ams. EEG to evaluate for seizure.   Level of alertness: lethargic  AEDs during EEG study: None  Technical aspects: This EEG study was done with scalp electrodes positioned according to the 10-20 International system of electrode placement. Electrical activity was acquired at a sampling rate of 500Hz  and reviewed with a high frequency filter of 70Hz  and a low frequency filter of 1Hz . EEG data were recorded continuously and digitally stored.   DESCRIPTION: EEG showed continuous rhythmic 2-3Hz  delta slow, maximum left hemisphere. There were also sharp waves in left frontal region, maximum FP1/F3. No clear seizures were seen. Hyperventilation and photic stimulation were not performed.  ABNORMALITY: 1. Continuous rhythmic slow, generalized, maximal left hemisphere 2. 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 There is also cortical dysfunction in left hemisphere likely secondary to underlying SDH. Additionally there is evidence of severe diffuse encephalopathy. No definite seizures were seen during this study.  Dr Cheral Marker was notified.

## 2019-08-30 NOTE — ED Triage Notes (Signed)
Pt came via [facility]. Wife called EMS stating he has been confused for the past week but today around 1545 be became unresponsive while on bedside commode at home. Did not fall or hit himself. Hx of Dementia (baseline is a&o x4) mostly. VSS with EMS. Pt is currently awake and follows commands.

## 2019-08-30 NOTE — Progress Notes (Signed)
EEG complete - results pending 

## 2019-08-31 ENCOUNTER — Inpatient Hospital Stay (HOSPITAL_COMMUNITY): Payer: Medicare Other

## 2019-08-31 DIAGNOSIS — I509 Heart failure, unspecified: Secondary | ICD-10-CM

## 2019-08-31 DIAGNOSIS — N179 Acute kidney failure, unspecified: Secondary | ICD-10-CM

## 2019-08-31 DIAGNOSIS — S065X9A Traumatic subdural hemorrhage with loss of consciousness of unspecified duration, initial encounter: Secondary | ICD-10-CM

## 2019-08-31 DIAGNOSIS — E872 Acidosis, unspecified: Secondary | ICD-10-CM

## 2019-08-31 DIAGNOSIS — K7460 Unspecified cirrhosis of liver: Secondary | ICD-10-CM

## 2019-08-31 DIAGNOSIS — S065XAA Traumatic subdural hemorrhage with loss of consciousness status unknown, initial encounter: Secondary | ICD-10-CM

## 2019-08-31 DIAGNOSIS — K7682 Hepatic encephalopathy: Secondary | ICD-10-CM

## 2019-08-31 DIAGNOSIS — K7200 Acute and subacute hepatic failure without coma: Secondary | ICD-10-CM

## 2019-08-31 LAB — BASIC METABOLIC PANEL
Anion gap: 11 (ref 5–15)
BUN: 53 mg/dL — ABNORMAL HIGH (ref 8–23)
CO2: 16 mmol/L — ABNORMAL LOW (ref 22–32)
Calcium: 9.4 mg/dL (ref 8.9–10.3)
Chloride: 113 mmol/L — ABNORMAL HIGH (ref 98–111)
Creatinine, Ser: 4.63 mg/dL — ABNORMAL HIGH (ref 0.61–1.24)
GFR calc Af Amer: 13 mL/min — ABNORMAL LOW (ref >60)
GFR calc non Af Amer: 11 mL/min — ABNORMAL LOW (ref >60)
Glucose, Bld: 144 mg/dL — ABNORMAL HIGH (ref 70–99)
Potassium: 4.3 mmol/L (ref 3.5–5.1)
Sodium: 140 mmol/L (ref 135–145)

## 2019-08-31 LAB — HEPATIC FUNCTION PANEL
ALT: 24 U/L (ref 0–44)
AST: 20 U/L (ref 15–41)
Albumin: 2.9 g/dL — ABNORMAL LOW (ref 3.5–5.0)
Alkaline Phosphatase: 107 U/L (ref 38–126)
Bilirubin, Direct: 0.5 mg/dL — ABNORMAL HIGH (ref 0.0–0.2)
Indirect Bilirubin: 2.2 mg/dL — ABNORMAL HIGH (ref 0.3–0.9)
Total Bilirubin: 2.7 mg/dL — ABNORMAL HIGH (ref 0.3–1.2)
Total Protein: 7 g/dL (ref 6.5–8.1)

## 2019-08-31 LAB — GLUCOSE, CAPILLARY
Glucose-Capillary: 139 mg/dL — ABNORMAL HIGH (ref 70–99)
Glucose-Capillary: 136 mg/dL — ABNORMAL HIGH (ref 70–99)
Glucose-Capillary: 170 mg/dL — ABNORMAL HIGH (ref 70–99)
Glucose-Capillary: 190 mg/dL — ABNORMAL HIGH (ref 70–99)
Glucose-Capillary: 158 mg/dL — ABNORMAL HIGH (ref 70–99)
Glucose-Capillary: 199 mg/dL — ABNORMAL HIGH (ref 70–99)
Glucose-Capillary: 190 mg/dL — ABNORMAL HIGH (ref 70–99)

## 2019-08-31 LAB — CBC
HCT: 38.9 % — ABNORMAL LOW (ref 39.0–52.0)
Hemoglobin: 13.3 g/dL (ref 13.0–17.0)
MCH: 31.7 pg (ref 26.0–34.0)
MCHC: 34.2 g/dL (ref 30.0–36.0)
MCV: 92.8 fL (ref 80.0–100.0)
Platelets: 45 10*3/uL — ABNORMAL LOW (ref 150–400)
RBC: 4.19 MIL/uL — ABNORMAL LOW (ref 4.22–5.81)
RDW: 15.1 % (ref 11.5–15.5)
WBC: 8.9 10*3/uL (ref 4.0–10.5)
nRBC: 0 % (ref 0.0–0.2)

## 2019-08-31 LAB — AMMONIA: Ammonia: 84 umol/L — ABNORMAL HIGH (ref 9–35)

## 2019-08-31 LAB — ECHOCARDIOGRAM COMPLETE

## 2019-08-31 MED ORDER — PHENYTOIN SODIUM 50 MG/ML IJ SOLN
100.0000 mg | Freq: Three times a day (TID) | INTRAMUSCULAR | Status: DC
  Administered 2019-08-31 – 2019-09-01 (×3): 100 mg via INTRAVENOUS
  Filled 2019-08-31 (×3): qty 2

## 2019-08-31 MED ORDER — SODIUM CHLORIDE 0.9 % IV SOLN
1500.0000 mg | Freq: Once | INTRAVENOUS | Status: AC
  Administered 2019-08-31: 05:00:00 1500 mg via INTRAVENOUS
  Filled 2019-08-31: qty 30

## 2019-08-31 MED ORDER — SODIUM CHLORIDE 0.9 % IV SOLN
INTRAVENOUS | Status: AC
  Administered 2019-08-31 – 2019-09-01 (×2): via INTRAVENOUS

## 2019-08-31 NOTE — Progress Notes (Signed)
  Echocardiogram 2D Echocardiogram has been performed.  Dennis Mcneil 08/31/2019, 10:46 AM

## 2019-08-31 NOTE — Plan of Care (Signed)
Progressing towards discharge 

## 2019-08-31 NOTE — Evaluation (Signed)
Clinical/Bedside Swallow Evaluation Patient Details  Name: Dennis Mcneil MRN: [id number] Date of Birth: 1941/09/10  Today's Date: 08/31/2019 Time: SLP Start Time (ACUTE ONLY): 1336 SLP Stop Time (ACUTE ONLY): 1350 SLP Time Calculation (min) (ACUTE ONLY): 14 min  Past Medical History:  Past Medical History:  Diagnosis Date  . Diabetes mellitus without complication ([place])   . Hypertension    Past Surgical History: No past surgical history on file. HPI:  Pt is a 78 yo male admitted with AMS over the past week; R sided weakness noted in the ED. Pt had a recent admission in July 2020 at [facility] for SDH s/p fall. CT Head upon admission showed a subacute L frontal SDH; MRI pending. PMH includes: HTN, DM, cirrhosis, CKD, HLD, gout   Assessment / Plan / Recommendation Clinical Impression  Pt's swallow function appears to be primarily impacted by pt's mentation at this time. He is alert but does not open his eyes despite cues, impacting his bolus awareness. Mod cues were needed for sustained attention. He kept telling me throughout trials that "they widened his tongue" but also denied any sensory changes. No focal weakness was noted. A formal oral motor exam was attempted but could not be completed because pt was not following commands consistently. He did consistently open his mouth in anticipation of subsequent boluses and initiated pharyngeal swallows without overt s/s of aspiration even across three ounces of water. Multiple subswallows were noted after boluses of puree - question the impact of his large bore NGT. Recommend starting a full liquid diet today with full supervision for safety. Would like to see how he could perform without his NGT in, but his mentation will also likely need to clear more before he can advance to more complex solids. SLP will continue to follow. SLP Visit Diagnosis: Dysphagia, unspecified (R13.10)    Aspiration Risk  Moderate aspiration risk    Diet Recommendation Thin  liquid   Liquid Administration via: Cup;Straw Medication Administration: Crushed with puree Supervision: Staff to assist with self feeding;Full supervision/cueing for compensatory strategies Compensations: Minimize environmental distractions;Slow rate;Small sips/bites Postural Changes: Seated upright at 90 degrees    Other  Recommendations Oral Care Recommendations: Oral care BID   Follow up Recommendations (tba)      Frequency and Duration min 2x/week  2 weeks       Prognosis Prognosis for Safe Diet Advancement: Good Barriers to Reach Goals: Cognitive deficits      Swallow Study   General HPI: Pt is a 78 yo male admitted with AMS over the past week; R sided weakness noted in the ED. Pt had a recent admission in July 2020 at [facility] for SDH s/p fall. CT Head upon admission showed a subacute L frontal SDH; MRI pending. PMH includes: HTN, DM, cirrhosis, CKD, HLD, gout Type of Study: Bedside Swallow Evaluation Previous Swallow Assessment: none in chart Diet Prior to this Study: NPO;NG Tube(not receiving TF) Temperature Spikes Noted: No Respiratory Status: Room air History of Recent Intubation: No Behavior/Cognition: Alert;Cooperative;Confused;Requires cueing Oral Cavity Assessment: Within Functional Limits Oral Care Completed by SLP: No Oral Cavity - Dentition: Edentulous Vision: (keeps eyes closed despite cues to open) Self-Feeding Abilities: Total assist Patient Positioning: Upright in bed Baseline Vocal Quality: Normal Volitional Swallow: Unable to elicit    Oral/Motor/Sensory Function Overall Oral Motor/Sensory Function: (difficulty following commands but appears nonfocal)   Ice Chips Ice chips: Within functional limits Presentation: Spoon   Thin Liquid Thin Liquid: Within functional limits Presentation: Straw;Spoon  Nectar Thick Nectar Thick Liquid: Not tested   Honey Thick Honey Thick Liquid: Not tested   Puree Puree: Impaired Presentation: Spoon Oral Phase  Impairments: Poor awareness of bolus Pharyngeal Phase Impairments: Multiple swallows   Solid     Solid: Not tested      [name] 08/31/2019,2:46 PM  [name], M.A. [place] Acute Environmental education officer [phone] Office [phone]

## 2019-08-31 NOTE — Progress Notes (Addendum)
Patient's BP 166/80     PRN BP meds given  RN will continue to monitor patient

## 2019-08-31 NOTE — Progress Notes (Addendum)
Neurology Progress Note   S:// No acute changes Mental status improved. Started on Dilantin overnight.   O:// Current vital signs: BP (!) 143/78 (BP Location: Right Arm)   Pulse 67   Temp 98.2 F (36.8 C) (Axillary)   Resp 13   SpO2 99%  Vital signs in last 24 hours: Temp:  [98 F (36.7 C)-98.6 F (37 C)] 98.2 F (36.8 C) (09/11 1100) Pulse Rate:  [58-69] 67 (09/11 1100) Resp:  [10-14] 13 (09/11 1100) BP: (135-167)/(78-96) 143/78 (09/11 1100) SpO2:  [99 %-100 %] 99 % (09/11 1100) Neurological exam Awake alert, able to tell me his name.  Unable to tell me what exactly is at this time or date or time. Follows commands purposefully Cranial nerves: Pupils equal round react light, extra movement intact, mild right lower facial weakness, blinks to threat from both sides,. Motor exam: Antigravity in all 4 extremities at least 4+/5. Tone is normal.  Bulk is normal Sensation: Intact light touch all over Coordination: Intact finger-nose-finger testing. Gait testing deferred due to mentation   Medications  Current Facility-Administered Medications:  .  0.9 %  sodium chloride infusion, , Intravenous, Continuous, Pahwani, Ravi, MD, Last Rate: 75 mL/hr at 08/31/19 1344 .  acetaminophen (TYLENOL) tablet 650 mg, 650 mg, Oral, Q6H PRN **OR** acetaminophen (TYLENOL) suppository 650 mg, 650 mg, Rectal, Q6H PRN, Shela Leff, MD .  hydrALAZINE (APRESOLINE) injection 5 mg, 5 mg, Intravenous, Q4H PRN, Shela Leff, MD, 5 mg at 08/31/19 0455 .  insulin aspart (novoLOG) injection 0-9 Units, 0-9 Units, Subcutaneous, Q4H, Shela Leff, MD, 2 Units at 08/31/19 1753 .  lactulose (CHRONULAC) 10 GM/15ML solution 30 g, 30 g, Per Tube, Q6H PRN, Shela Leff, MD .  phenytoin (DILANTIN) injection 100 mg, 100 mg, Intravenous, Q8H, Kerney Elbe, MD, 100 mg at 08/31/19 1342 .  rifaximin (XIFAXAN) tablet 550 mg, 550 mg, Per Tube, BID, Shela Leff, MD, 550 mg at 08/30/19  2213 Labs CBC    Component Value Date/Time   WBC 8.9 08/31/2019 0508   RBC 4.19 (L) 08/31/2019 0508   HGB 13.3 08/31/2019 0508   HCT 38.9 (L) 08/31/2019 0508   PLT 45 (L) 08/31/2019 0508   MCV 92.8 08/31/2019 0508   MCH 31.7 08/31/2019 0508   MCHC 34.2 08/31/2019 0508   RDW 15.1 08/31/2019 0508   LYMPHSABS 1.0 08/30/2019 1734   MONOABS 0.6 08/30/2019 1734   EOSABS 0.2 08/30/2019 1734   BASOSABS 0.0 08/30/2019 1734    CMP     Component Value Date/Time   NA 140 08/31/2019 0508   K 4.3 08/31/2019 0508   CL 113 (H) 08/31/2019 0508   CO2 16 (L) 08/31/2019 0508   GLUCOSE 144 (H) 08/31/2019 0508   BUN 53 (H) 08/31/2019 0508   CREATININE 4.63 (H) 08/31/2019 0508   CALCIUM 9.4 08/31/2019 0508   PROT 7.0 08/31/2019 0508   ALBUMIN 2.9 (L) 08/31/2019 0508   AST 20 08/31/2019 0508   ALT 24 08/31/2019 0508   ALKPHOS 107 08/31/2019 0508   BILITOT 2.7 (H) 08/31/2019 0508   GFRNONAA 11 (L) 08/31/2019 0508   GFRAA 13 (L) 08/31/2019 [id number]  Based on chart review creatinine above baseline. EEG with suggestion of epileptogenic city in the left frontal region.   Imaging I have reviewed images in epic and the results pertinent to this consultation are: MRI of the brain with a left-sided subdural.  Chronic white matter changes.  Assessment: 78 year old man with history of hypertension, diabetes, cirrhosis, left-sided subdural hematoma  with recent admission in August at [facility], with worsening mentation over the past 1 week. Ammonia levels on admission-195.  Today ammonia 84. Creatinine also above baseline He has had a significant progressive decline over the past weeks to months because of his subdural and I do not anticipate that he will be completely normal and would require time for improvement. I think his risk of seizure is adequately controlled with Dilantin. Other toxic metabolic derangements are also contributing to his altered mental status.  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   Recommendations: Continue with Dilantin - keppra avoided due to CKD Correction of toxic metabolic derangements including hyperammonemia but per the primary team Blood pressure control with goal less than 0000000 systolic  -- Amie Portland, MD [facility] Pager: [phone] If 7pm to 7am, please call on call as listed on AMION.

## 2019-08-31 NOTE — Progress Notes (Signed)
EEG showed epileptiform discharges not evolving to electrographic seizures.   Phenytoin load followed by 100 mg IV TID ordered.   Electronically signed: Dr. Kerney Elbe

## 2019-08-31 NOTE — Progress Notes (Signed)
SLP Cancellation Note  Patient Details Name: Dennis Mcneil MRN: [id number] DOB: [birth year]   Cancelled treatment:       Reason Eval/Treat Not Completed: Medical issues which prohibited therapy - pt currently has NGT set to suction. Discussed with RN who recommends holding at this time, but will page if he is able to attempt PO trials later.   Venita Sheffield Dennis Mcneil 08/31/2019, 8:54 AM  [name], M.A. [place] Acute Environmental education officer [phone] Office [phone]

## 2019-08-31 NOTE — Progress Notes (Signed)
PROGRESS NOTE    Dennis Mcneil  [id number] DOB: 1941-05-06 DOA: 08/30/2019 PCP: Patient, No Pcp Per   Brief Narrative:  Dennis Mcneil is a 78 y.o. 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    Apparently, wife called EMS stating that he has been confused for the past week but on 08/30/2019 around 1545 became unresponsive while on bedside commode at home.  He did not fall or injure himself.  He was having difficulty speaking.  Vital signs stable per EMS.  In the ED, patient was noted to have right-sided weakness.  Code stroke activated.  Neurology consulted.  Patient was disoriented initially.  Per wife, patient has not been doing well since discharge from [place].  He was also diagnosed with liver cirrhosis 3 years ago.  According to her, she has been giving him lactulose every 5 hours religiously and that he has couple of bowel movements every day.  Patient was found to have significantly elevated ammonia level around 195. COVID negative.  Rest of the lab work was unremarkable.  CT head showed 30 mm late subacute to chronic subdural hematoma on left frontal lobe.  No new hemorrhage.  No acute ischemic infarct.  Neurology was consulted.  Given subdural hematoma, he was not a candidate for TPA.  Code stroke was canceled.  He is going to get MRI.  EEG showed epileptiform discharges not involving 2 electrographic seizures.   Assessment & Plan:   Principal Problem:   AMS (altered mental status) Active Problems:   Liver cirrhosis (HCC)   Acute hepatic encephalopathy   Subdural hematoma (HCC)   AKI (acute kidney injury) (HCC)   Metabolic acidosis   Altered  mental status/acute metabolic encephalopathy/?  Seizure/acute hepatic encephalopathy: Patient seems to have combination of acute hepatic encephalopathy and metabolic encephalopathy secondary to possible seizure at the area of subdural hematoma.  He is on lactulose and his ammonia has improved down to less than 90 today.  He is somewhat more alert but still confused.  No focal deficit on my exam.  Neurology on board.  He is yet to get his MRI done today.  Received a loading dose of Dilantin yesterday and he is on scheduled Dilantin.  We will continue lactulose for his acute hepatic encephalopathy but defer to neurology for his seizure management.  Decompensated liver cirrhosis: Per [facility] records, cirrhosis of unclear etiology, suspected EtOH versus NASH.  Continue lactulose, rifaximin.  Repeat ammonia level tomorrow.  Ultrasound abdomen pending.  Monitor LFTs.  Subacute-chronic left subdural hematoma: Appears to be slightly smaller on current CT.  No new hemorrhage.  Neurochecks. Holding antiplatelet/anticoagulation.  Normal anion gap metabolic acidosis: Bicarb 12 at presentation currently 16.  Does not have any signs of acidosis.  Likely due to acute on chronic kidney disease.  AKI on CKD stage IV: Reportedly, recent creatinine was 2.9 in August.  Currently 4.8 and stable.  Interestingly, there is no record of I's and O's.  Although chest x-ray does show some vascular congestion but he is not hypoxic, not dyspneic and does not have any crackles on exam.  We will start him on gentle IV hydration.  History of dysphagia: Remains n.p.o.  SLP to see.  Type 2 diabetes mellitus: Reportedly his last hemoglobin A1c was 7.3 on 06/26/2019 per [facility] records.  Blood sugar control.  Continue SSI.  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  No signs of bleeding.  Monitor daily.  DVT prophylaxis: SCD Code Status: DNR Family Communication: Wife was at the bedside.  Plan of care discussed with  wife. Disposition Plan: TBD  Consultants:   Neurology  Procedures:   None  Antimicrobials:   None   Subjective: Patient seen and examined.  He was getting echo done.  He was slightly lethargic and somewhat confused.  Did not have any complaint.  He recognized his wife at bedside.  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  Objective: Vitals:   08/31/19 0125 08/31/19 0453 08/31/19 0606 08/31/19 0700  BP: (!) 164/91 (!) 166/80 (!) 144/82 135/82  Pulse: (!) 58 61 66 69  Resp: 14 14  11   Temp: 98.6 F (37 C) 98 F (36.7 C)  98.2 F (36.8 C)  TempSrc: Axillary Axillary  Axillary  SpO2: 100% 100% 100% 100%   No intake or output data in the 24 hours ending 08/31/19 1122 There were no vitals filed for this visit.  Examination:  General exam: Appears calm and comfortable but slightly lethargic Respiratory system: Clear to auscultation. Respiratory effort normal. Cardiovascular system: S1 & S2 heard, RRR. No JVD, murmurs, rubs, gallops or clicks. No pedal edema. Gastrointestinal system: Abdomen is nondistended, soft and nontender. No organomegaly or masses felt. Normal bowel sounds heard. Central nervous system: Lethargic and slightly disoriented. No focal neurological deficits. Extremities: Symmetric 5 x 5 power. Skin: No rashes, lesions or ulcers Psychiatry: Judgement and insight appear poor. Mood & affect flat   Data Reviewed: I have personally reviewed following labs and imaging studies  CBC: Recent Labs  Lab 08/30/19 1734 08/31/19 0508  WBC 5.6 8.9  NEUTROABS 3.8  --   HGB 11.5* 13.3  HCT 35.2* 38.9*  MCV 96.7 92.8  PLT 61* 45*   Basic Metabolic Panel: Recent Labs  Lab 08/30/19 1734 08/31/19 0508  NA 139 140  K 4.0 4.3  CL 117* 113*  CO2 12* 16*  GLUCOSE 127* 144*  BUN 51* 53*  CREATININE 4.82* 4.63*  CALCIUM 9.3 9.4   GFR: CrCl cannot be calculated (Unknown ideal weight.). Liver Function Tests: Recent Labs  Lab 08/30/19 1734  08/31/19 0508  AST 21 20  ALT 23 24  ALKPHOS 113 107  BILITOT 2.1* 2.7*  PROT 7.2 7.0  ALBUMIN 2.8* 2.9*   No results for input(s): LIPASE, AMYLASE in the last 168 hours. Recent Labs  Lab 08/30/19 1735 08/31/19 0508  AMMONIA 195* 84*   Coagulation Profile: Recent Labs  Lab 08/30/19 1734  INR 1.6*   Cardiac Enzymes: No results for input(s): CKTOTAL, CKMB, CKMBINDEX, TROPONINI in the last 168 hours. BNP (last 3 results) No results for input(s): PROBNP in the last 8760 hours. HbA1C: No results for input(s): HGBA1C in the last 72 hours. CBG: Recent Labs  Lab 08/30/19 2150 08/30/19 2358 08/31/19 0145 08/31/19 0424 08/31/19 0755  GLUCAP 163* 155* 139* 136* 170*   Lipid Profile: No results for input(s): CHOL, HDL, LDLCALC, TRIG, CHOLHDL, LDLDIRECT in the last 72 hours. Thyroid Function Tests: No results for input(s): TSH, T4TOTAL, FREET4, T3FREE, THYROIDAB in the last 72 hours. Anemia Panel: No results for input(s): VITAMINB12, FOLATE, FERRITIN, TIBC, IRON, RETICCTPCT in the last 72 hours. Sepsis Labs: Recent Labs  Lab 08/30/19 1737  LATICACIDVEN  2.0*    Recent Results (from the past 240 hour(s))  SARS Coronavirus 2 [facility] order, Performed in [facility] hospital lab) Nasopharyngeal Nasopharyngeal Swab     Status: None   Collection Time: 08/30/19  8:21 PM   Specimen: Nasopharyngeal Swab  Result Value Ref Range Status   SARS Coronavirus 2 NEGATIVE NEGATIVE Final    Comment: (NOTE) If result is NEGATIVE SARS-CoV-2 target nucleic acids are NOT DETECTED. The SARS-CoV-2 RNA is generally detectable in upper and lower  respiratory specimens during the acute phase of infection. The lowest  concentration of SARS-CoV-2 viral copies this assay can detect is 250  copies / mL. A negative result does not preclude SARS-CoV-2 infection  and should not be used as the sole basis for treatment or other  patient management decisions.  A negative result may occur with   improper specimen collection / handling, submission of specimen other  than nasopharyngeal swab, presence of viral mutation(s) within the  areas targeted by this assay, and inadequate number of viral copies  (<250 copies / mL). A negative result must be combined with clinical  observations, patient history, and epidemiological information. If result is POSITIVE SARS-CoV-2 target nucleic acids are DETECTED. The SARS-CoV-2 RNA is generally detectable in upper and lower  respiratory specimens dur ing the acute phase of infection.  Positive  results are indicative of active infection with SARS-CoV-2.  Clinical  correlation with patient history and other diagnostic information is  necessary to determine patient infection status.  Positive results do  not rule out bacterial infection or co-infection with other viruses. If result is PRESUMPTIVE POSTIVE SARS-CoV-2 nucleic acids MAY BE PRESENT.   A presumptive positive result was obtained on the submitted specimen  and confirmed on repeat testing.  While 2019 novel coronavirus  (SARS-CoV-2) nucleic acids may be present in the submitted sample  additional confirmatory testing may be necessary for epidemiological  and / or clinical management purposes  to differentiate between  SARS-CoV-2 and other Sarbecovirus currently known to infect humans.  If clinically indicated additional testing with an alternate test  methodology [phone]) is advised. The SARS-CoV-2 RNA is generally  detectable in upper and lower respiratory sp ecimens during the acute  phase of infection. The expected result is Negative. Fact Sheet for Patients:  StrictlyIdeas.no Fact Sheet for Healthcare Providers: BankingDealers.co.za This test is not yet approved or cleared by the [place] FDA and has been authorized for detection and/or diagnosis of SARS-CoV-2 by FDA under an Emergency Use Authorization (EUA).  This EUA will  remain in effect (meaning this test can be used) for the duration of the COVID-19 declaration under Section 564(b)(1) of the Act, 21 U.S.C. section 360bbb-3(b)(1), unless the authorization is terminated or revoked sooner. Performed at [facility], [place] [address], [place], [place] [address]       Radiology Studies: Dg Chest Portable 1 View  Result Date: 08/31/2019 CLINICAL DATA:  MRI clearance EXAM: PORTABLE CHEST 1 VIEW COMPARISON:  None. FINDINGS: NG tube enters the stomach. Cardiomegaly with vascular congestion. No confluent opacities, effusions or edema. No radiopaque foreign bodies. No pacer or other cardiac device seen. IMPRESSION: Cardiomegaly, vascular congestion. Electronically Signed   By: Rolm Baptise M.D.   On: 08/31/2019 00:22   Dg Abd Portable 1 View  Result Date: 08/30/2019 CLINICAL DATA:  NG tube placement EXAM: PORTABLE ABDOMEN - 1 VIEW COMPARISON:  06/25/2019 FINDINGS: Esophagogastric tube is positioned with tip and side port below the diaphragm, tip positioned  over the gastric body. Nonobstructive pattern of partially imaged bowel gas. IMPRESSION: Esophagogastric tube is positioned with tip and side port below the diaphragm, tip positioned over the gastric body. Electronically Signed   By: Eddie Candle M.D.   On: 08/30/2019 20:27   Ct Head Code Stroke Wo Contrast  Result Date: 08/30/2019 CLINICAL DATA:  Code stroke.  Altered level of consciousness. EXAM: CT HEAD WITHOUT CONTRAST TECHNIQUE: Contiguous axial images were obtained from the base of the skull through the vertex without intravenous contrast. COMPARISON:  CT head 07/29/2019, 06/25/2019 FINDINGS: Brain: Low-density subdural hematoma on the left measures 13 mm in thickness on coronal images. This is mildly improved in size since the prior study. High density blood products seen on the prior study in the subdural hematoma have resolved. No acute or new hemorrhage. 2 mm midline shift to the right unchanged.  Ventricle size normal. Chronic microvascular ischemic changes in the white matter and left basal ganglia stable. No acute infarct or mass. Vascular: Atherosclerotic calcification distal vertebral arteries and bilateral carotid arteries. Skull: Negative Sinuses/Orbits: Mild mucosal edema paranasal sinuses.  Normal orbit. Other: None ASPECTS ([place] Stroke Program Early CT Score) - Ganglionic level infarction (caudate, lentiform nuclei, internal capsule, insula, M1-M3 cortex): 7 - Supraganglionic infarction (M4-M6 cortex): 3 Total score (0-10 with 10 being normal): 10 IMPRESSION: 1. 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 No new area of hemorrhage. 2. No acute ischemic infarct. 3. ASPECTS is 10 4. These results were called by telephone at the time of interpretation on 08/30/2019 at 6:01 pm to provider Rory Percy , who verbally acknowledged these results. Electronically Signed   By: Franchot Gallo M.D.   On: 08/30/2019 18:02    Scheduled Meds:  insulin aspart  0-9 Units Subcutaneous Q4H   phenytoin (DILANTIN) IV  100 mg Intravenous Q8H   rifaximin  550 mg Per Tube BID   Continuous Infusions:   LOS: 1 day   Time spent: 43 minutes   Darliss Cheney, MD [facility] Pager [phone]  If 7PM-7AM, please contact night-coverage [email or web address] Password [facility] 08/31/2019, 11:22 AM

## 2019-08-31 NOTE — Progress Notes (Signed)
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 In the ED, patient was noted to have right-sided weakness. Code stroke activated. Neurology consulted. He was also diagnosed with liver cirrhosis 3 years ago.  According to her, she has been giving him lactulose every 5 hours religiously and that he has couple of bowel movements every day.  Patient was found to have significantly elevated ammonia level around 195-->now84. COVID negative.  Rest of the lab work was unremarkable.  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  No new hemorrhage.  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 Per neurology,do not anticipate that he will be completely normal and would require time for improvement.They feel his seizure risk is adequately controlled with Dilantin. They recommend correction of metabolic derangements/ammonia and SBP to be<160.   Subjective: Patient seen this morning in rounds along with bedside nurse.  He appears to be awake, alert and oriented to person (was able to name himself and wife) and place (was able to state that he was in a hospital or at [facility]).  He is disoriented to time.  His breakfast tray is bedside and he has not eaten any.  Per nurse patient wanted to sleep for a little bit.  According to nurse, he has  been able to swallow okay.  Objective: Vitals:   09/01/19 0529 09/01/19 0700 09/01/19 1100 09/01/19 1700  BP: 130/75 (!) 147/77 (!) 145/78 128/76  Pulse: 64 63 61 65  Resp: 12 15 13 15   Temp:  98.2 F (36.8 C) 97.7 F (36.5 C) 98.5 F (36.9 C)  TempSrc:  Axillary Axillary Oral  SpO2: 100% 99% 100% 100%  Weight:      Height:        Intake/Output Summary (Last 24 hours) at 09/01/2019 1925 Last data filed at 09/01/2019 0400 Gross per 24 hour  Intake 1070 ml  Output 925 ml  Net 145 ml   Filed Weights   08/31/19 2000  Weight: 93.6 kg    Physical Examination:  General exam: Appears calm and comfortable  Respiratory system: Clear to auscultation. Respiratory effort normal. Cardiovascular system: S1 & S2 heard, RRR. No JVD, murmurs, rubs, gallops or clicks. No pedal edema. Gastrointestinal system: Abdomen is nondistended, soft and nontender. No organomegaly or masses felt. Normal bowel sounds heard.  Condom catheter in place Central nervous system: Alert and oriented x2. No focal neurological deficits. Extremities: Symmetric 5 x 5 power. Skin: No rashes, lesions or ulcers Psychiatry: Judgement and insight appear to be improving. Mood & affect appropriate.     Data Reviewed: I have personally reviewed following labs and imaging studies  CBC: Recent Labs  Lab 08/30/19 1734 08/31/19 0508 09/01/19 0407  WBC 5.6 8.9 6.2  NEUTROABS 3.8  --  4.4  HGB 11.5* 13.3 11.1*  HCT 35.2* 38.9* 33.8*  MCV 96.7 92.8 95.5  PLT 61* 45* 29*   Basic Metabolic  Panel: Recent Labs  Lab 08/30/19 1734 08/31/19 0508 09/01/19 0407  NA 139 140 140  K 4.0 4.3 4.3  CL 117* 113* 112*  CO2 12* 16* 16*  GLUCOSE 127* 144* 149*  BUN 51* 53* 55*  CREATININE 4.82* 4.63* 3.93*  CALCIUM 9.3 9.4 8.8*  MG  --   --  1.8   GFR: Estimated Creatinine Clearance: 17.8 mL/min (A) (by C-G formula based on SCr of 3.93 mg/dL (H)). Liver Function Tests: Recent Labs  Lab 08/30/19 1734 08/31/19 0508  09/01/19 0407  AST 21 20 25   ALT 23 24 21   ALKPHOS 113 107 105  BILITOT 2.1* 2.7* 2.8*  PROT 7.2 7.0 6.8  ALBUMIN 2.8* 2.9* 2.7*   No results for input(s): LIPASE, AMYLASE in the last 168 hours. Recent Labs  Lab 08/30/19 1735 08/31/19 0508 09/01/19 1335  AMMONIA 195* 84* 163*   Coagulation Profile: Recent Labs  Lab 08/30/19 1734  INR 1.6*   Cardiac Enzymes: No results for input(s): CKTOTAL, CKMB, CKMBINDEX, TROPONINI in the last 168 hours. BNP (last 3 results) No results for input(s): PROBNP in the last 8760 hours. HbA1C: No results for input(s): HGBA1C in the last 72 hours. CBG: Recent Labs  Lab 08/31/19 2325 09/01/19 0338 09/01/19 0830 09/01/19 1234 09/01/19 1728  GLUCAP 190* 146* 134* 220* 233*   Lipid Profile: No results for input(s): CHOL, HDL, LDLCALC, TRIG, CHOLHDL, LDLDIRECT in the last 72 hours. Thyroid Function Tests: No results for input(s): TSH, T4TOTAL, FREET4, T3FREE, THYROIDAB in the last 72 hours. Anemia Panel: No results for input(s): VITAMINB12, FOLATE, FERRITIN, TIBC, IRON, RETICCTPCT in the last 72 hours. Sepsis Labs: Recent Labs  Lab 08/30/19 1737  LATICACIDVEN 2.0*    Recent Results (from the past 240 hour(s))  SARS Coronavirus 2 [facility] order, Performed in [facility] hospital lab) Nasopharyngeal Nasopharyngeal Swab     Status: None   Collection Time: 08/30/19  8:21 PM   Specimen: Nasopharyngeal Swab  Result Value Ref Range Status   SARS Coronavirus 2 NEGATIVE NEGATIVE Final    Comment: (NOTE) If result is NEGATIVE SARS-CoV-2 target nucleic acids are NOT DETECTED. The SARS-CoV-2 RNA is generally detectable in upper and lower  respiratory specimens during the acute phase of infection. The lowest  concentration of SARS-CoV-2 viral copies this assay can detect is 250  copies / mL. A negative result does not preclude SARS-CoV-2 infection  and should not be used as the sole basis for treatment or other  patient management  decisions.  A negative result may occur with  improper specimen collection / handling, submission of specimen other  than nasopharyngeal swab, presence of viral mutation(s) within the  areas targeted by this assay, and inadequate number of viral copies  (<250 copies / mL). A negative result must be combined with clinical  observations, patient history, and epidemiological information. If result is POSITIVE SARS-CoV-2 target nucleic acids are DETECTED. The SARS-CoV-2 RNA is generally detectable in upper and lower  respiratory specimens dur ing the acute phase of infection.  Positive  results are indicative of active infection with SARS-CoV-2.  Clinical  correlation with patient history and other diagnostic information is  necessary to determine patient infection status.  Positive results do  not rule out bacterial infection or co-infection with other viruses. If result is PRESUMPTIVE POSTIVE SARS-CoV-2 nucleic acids MAY BE PRESENT.   A presumptive positive result was obtained on the submitted specimen  and confirmed on repeat testing.  While 2019 novel coronavirus  (SARS-CoV-2)  nucleic acids may be present in the submitted sample  additional confirmatory testing may be necessary for epidemiological  and / or clinical management purposes  to differentiate between  SARS-CoV-2 and other Sarbecovirus currently known to infect humans.  If clinically indicated additional testing with an alternate test  methodology [phone]) is advised. The SARS-CoV-2 RNA is generally  detectable in upper and lower respiratory sp ecimens during the acute  phase of infection. The expected result is Negative. Fact Sheet for Patients:  StrictlyIdeas.no Fact Sheet for Healthcare Providers: BankingDealers.co.za This test is not yet approved or cleared by the [place] FDA and has been authorized for detection and/or diagnosis of SARS-CoV-2 by FDA under an  Emergency Use Authorization (EUA).  This EUA will remain in effect (meaning this test can be used) for the duration of the COVID-19 declaration under Section 564(b)(1) of the Act, 21 U.S.C. section 360bbb-3(b)(1), unless the authorization is terminated or revoked sooner. Performed at [facility], [place] [address] [place], [place] [address]       Radiology Studies: Mr Brain 50 Contrast  Result Date: 08/31/2019 CLINICAL DATA:  78 year old male with complicated diabetes. Subdural hematoma in July this year. Altered mental status, encephalopathy now. EXAM: MRI HEAD WITHOUT CONTRAST TECHNIQUE: Multiplanar, multiecho pulse sequences of the brain and surrounding structures were obtained without intravenous contrast. COMPARISON:  Head CT 08/30/2019. FINDINGS: Brain: Mixed signal biconvex left side subdural hematoma measures up to 13 millimeters in thickness on series 9, image 15. Stable mild mass effect on the left hemisphere. Only trace rightward midline shift. Basilar cisterns remain patent. Fairly numerous scattered chronic microhemorrhages in the brain which are most concentrated in the bilateral deep gray nuclei (series 3, image 56) and the brainstem. No intraventricular or other extra-axial hemorrhage. No restricted diffusion to suggest acute infarction. No ventriculomegaly. Cervicomedullary junction and pituitary are within normal limits. Patchy and confluent bilateral cerebral white matter T2 and FLAIR hyperintensity. No definite cortical encephalomalacia. Chronic deep gray nuclei lacunar infarcts. Linear Wallerian degeneration or chronic ischemia in the pons on series 7, image 8. Negative cerebellum. Vascular: Loss of the distal left vertebral artery flow void on series 7, image 3. Other Major intracranial vascular flow voids are preserved. Skull and upper cervical spine: Negative visible cervical spine. Normal bone marrow signal. Sinuses/Orbits: Negative orbits. Trace to mild paranasal sinus  mucosal thickening. Other: Mastoids remain clear. There is a left side nasoenteric tube in place. IMPRESSION: 1. Left side subdural hematoma with mixed signal measures up to 13 mm in thickness and is stable from the CT yesterday. Mild mass effect on the left hemisphere is unchanged. 2. No other acute intracranial abnormality. 3. Underlying advanced chronic micro-hemorrhages in the deep gray nuclei, and to a lesser extent elsewhere in the brain. 4. Advanced cerebral white matter signal changes also likely small vessel disease related. Electronically Signed   By: Genevie Ann M.D.   On: 08/31/2019 16:59   US Abdomen Complete  Result Date: 08/31/2019 CLINICAL DATA:  Elevated liver function tests. EXAM: ABDOMEN ULTRASOUND COMPLETE COMPARISON:  Ultrasound of October 26, 2017. FINDINGS: Gallbladder: 2.3 cm gallstone is noted. Moderate gallbladder wall thickening is noted focally with possible 8 mm cyst. No sonographic Murphy's sign or pericholecystic fluid is noted. Common bile duct: Diameter: 4 mm which is within normal limits. Liver: No focal lesion identified. Within normal limits in parenchymal echogenicity. Portal vein is patent on color Doppler imaging with normal direction of blood flow towards the liver. IVC: No abnormality  visualized. Pancreas: Not visualized due to overlying bowel gas. Spleen: Size and appearance within normal limits. Right Kidney: Length: 9.2 cm. Echogenicity within normal limits. No mass or hydronephrosis visualized. Left Kidney: Length: 10.5 cm. 1 cm cyst is noted. Echogenicity within normal limits. No mass or hydronephrosis visualized. Abdominal aorta: No aneurysm visualized. Other findings: None. IMPRESSION: Cholelithiasis is noted with focal gallbladder wall thickening and probable 8 mm cyst seen within gallbladder wall. If there is clinical concern for cholecystitis, HIDA scan is recommended for further evaluation. Pancreas not visualized due to overlying bowel gas. Electronically Signed    By: Marijo Conception M.D.   On: 08/31/2019 16:48   Dg Chest Portable 1 View  Result Date: 08/31/2019 CLINICAL DATA:  MRI clearance EXAM: PORTABLE CHEST 1 VIEW COMPARISON:  None. FINDINGS: NG tube enters the stomach. Cardiomegaly with vascular congestion. No confluent opacities, effusions or edema. No radiopaque foreign bodies. No pacer or other cardiac device seen. IMPRESSION: Cardiomegaly, vascular congestion. Electronically Signed   By: Rolm Baptise M.D.   On: 08/31/2019 00:22   Dg Abd Portable 1 View  Result Date: 08/30/2019 CLINICAL DATA:  NG tube placement EXAM: PORTABLE ABDOMEN - 1 VIEW COMPARISON:  06/25/2019 FINDINGS: Esophagogastric tube is positioned with tip and side port below the diaphragm, tip positioned over the gastric body. Nonobstructive pattern of partially imaged bowel gas. IMPRESSION: Esophagogastric tube is positioned with tip and side port below the diaphragm, tip positioned over the gastric body. Electronically Signed   By: Eddie Candle M.D.   On: 08/30/2019 20:27        Scheduled Meds:  finasteride  5 mg Oral Daily   insulin aspart  0-9 Units Subcutaneous Q4H   lactulose  30 g Oral 5 X Daily   levETIRAcetam  250 mg Oral BID   rifaximin  550 mg Oral BID   Continuous Infusions:   Assessment & Plan:    1.  Acute metabolic encephalopathy: Present on admission secondary to seizures versus hepatic encephalopathy.  Seen by neurology and was started on Dilantin on admission--changed to [place] today.  Patient started on lactulose and received a dose on 9/10.  Noted that lactulose order is "PRN".  Changed to scheduled.  Appears to be mentating better overall.  If continues to do well and tolerate p.o. intake, will DC NG tube this afternoon.  Change meds from via PEG to oral.  2.  Decompensated liver cirrhosis/hepatic encephalopathy: As mentioned above lactulose changed to scheduled dosing.  His ammonia level came down from 1 95-84 yesterday.  Repeat level today.   Patient also has pancytopenia/severe thrombocytopenia at baseline.  Will need to monitor in the setting of SDH.  Patient likely has advanced liver disease/Child's criteria C given elevated bilirubin, elevated INR at 1.6, pancytopenia and hyperammonemia.  Currently Lasix/Aldactone on hold in concern for elevated creatinine.  Continue lactulose/rifaximin  3. Subacute-chronic left subdural hematoma: Appears to be slightly smaller on current CT.  No new hemorrhage on CT/MRI.  Neurochecks. Holding antiplatelet/anticoagulation.  Seen by neurology.Left-sided epileptogenicity without seizures on EEG.  Patient was discharged on prophylactic antiepileptics -500 mg twice daily Keppra from [place] in August.  It appears that wife stopped giving these medications on the advice of?  [place] home health RN.  Patient was started on Dilantin in the ED, per neurology, given concern for CKD.  This has however been changed to renal dose of Keppra-250 mg twice daily today in concern for thrombocytopenia.   4.  Acute on chronic thrombocytopenia:  Patient appears to have received platelet transfusions in the setting of SDH at [facility] in July.  Discussed with hematology regarding concerning levels at 29 today.  They will evaluate patient in a.m. but do not recommend platelet transfusions.  Will monitor mental status changes closely and low threshold for repeat CT head given concern for spontaneous bleeding with significantly low platelet levels.  Dilantin changed to [place] today by neurology.  Not on any other medications to cause thrombocytopenia.  Platelet antibodies ordered.  5.  Acute on CKD stage IV: Creatinine level appears to be slowly improving 4.8--> 3.9 today.  Home medications including losartan/Lasix/Aldactone have been on hold.  SBP fluctuating 1 20-1 40s.  Will resume losartan at a lower dose if blood pressure continues to be elevated.  6.  Dysphagia: In the setting of problem #1.  Improving with improving mental status.   Transition to p.o. diet/p.o. meds as tolerated.  7.Type 2 diabetes mellitus: Reportedly his last hemoglobin A1c was 7.3 on 06/26/2019 per [facility] records.  Blood sugar control.  Continue SSI.  8. ?  BPH: Resume home medications  DVT prophylaxis: Avoid anticoagulants given thrombocytopenia Code Status: DNR Family / Patient Communication: Discussed with wife in detail earlier this morning and again this afternoon.  Wife very upset about patient not getting lactulose yesterday and about change in antiepileptic regimen at the time of admission.  She was primarily upset about continued NG tube through the hospital course.  She calmed down after explaining the plan to remove NG tube this afternoon if patient tolerated diet.  She was again upset this afternoon when repeat ammonia level came back elevated at 163.  She was again reassured that lactulose was changed to scheduled dosing earlier this morning.  She was also reassured that patient's mental status is slowly improving which would be a better indicator than ammonia level itself.  She was also informed of the critical thrombocytopenia, change in antiepileptic regimen, rationale for doing so and risk of worsening SDH.  Wife is very difficult to communicate with and was agitated many at times with poor attention/listening ability while discussing issues with providers.  Irrespectively, she was reassured with lengthy discussions on both occasions of my conversations.  I also discussed with charge nurse/bedside nurse regarding timely lactulose administration and close observation of mental status given above concerns.  Nursing staff will also update wife periodically and try to gain her confidence.  I have also discussed with neurology Dr. Rory Percy and with hematology on-call. Disposition Plan: Home when medically cleared.     LOS: 2 days    Time spent: 60 minutes    Guilford Shi, MD [facility] Pager [phone]  If 7PM-7AM, please contact  night-coverage [email or web address] Password [email or web address] 09/01/2019, 7:25 PM

## 2019-08-31 NOTE — Progress Notes (Addendum)
Attempted to examine.  Undergoing 2D echocardiogram We will return back and examine

## 2019-09-01 DIAGNOSIS — K7200 Acute and subacute hepatic failure without coma: Principal | ICD-10-CM

## 2019-09-01 DIAGNOSIS — N179 Acute kidney failure, unspecified: Secondary | ICD-10-CM

## 2019-09-01 DIAGNOSIS — I10 Essential (primary) hypertension: Secondary | ICD-10-CM

## 2019-09-01 DIAGNOSIS — S065X9A Traumatic subdural hemorrhage with loss of consciousness of unspecified duration, initial encounter: Secondary | ICD-10-CM

## 2019-09-01 LAB — COMPREHENSIVE METABOLIC PANEL
ALT: 21 U/L (ref 0–44)
AST: 25 U/L (ref 15–41)
Albumin: 2.7 g/dL — ABNORMAL LOW (ref 3.5–5.0)
Alkaline Phosphatase: 105 U/L (ref 38–126)
Anion gap: 12 (ref 5–15)
BUN: 55 mg/dL — ABNORMAL HIGH (ref 8–23)
CO2: 16 mmol/L — ABNORMAL LOW (ref 22–32)
Calcium: 8.8 mg/dL — ABNORMAL LOW (ref 8.9–10.3)
Chloride: 112 mmol/L — ABNORMAL HIGH (ref 98–111)
Creatinine, Ser: 3.93 mg/dL — ABNORMAL HIGH (ref 0.61–1.24)
GFR calc Af Amer: 16 mL/min — ABNORMAL LOW (ref >60)
GFR calc non Af Amer: 14 mL/min — ABNORMAL LOW (ref >60)
Glucose, Bld: 149 mg/dL — ABNORMAL HIGH (ref 70–99)
Potassium: 4.3 mmol/L (ref 3.5–5.1)
Sodium: 140 mmol/L (ref 135–145)
Total Bilirubin: 2.8 mg/dL — ABNORMAL HIGH (ref 0.3–1.2)
Total Protein: 6.8 g/dL (ref 6.5–8.1)

## 2019-09-01 LAB — CBC WITH DIFFERENTIAL/PLATELET
Abs Immature Granulocytes: 0.01 10*3/uL (ref 0.00–0.07)
Basophils Absolute: 0 10*3/uL (ref 0.0–0.1)
Basophils Relative: 0 %
Eosinophils Absolute: 0.2 10*3/uL (ref 0.0–0.5)
Eosinophils Relative: 3 %
HCT: 33.8 % — ABNORMAL LOW (ref 39.0–52.0)
Hemoglobin: 11.1 g/dL — ABNORMAL LOW (ref 13.0–17.0)
Immature Granulocytes: 0 %
Lymphocytes Relative: 17 %
Lymphs Abs: 1 10*3/uL (ref 0.7–4.0)
MCH: 31.4 pg (ref 26.0–34.0)
MCHC: 32.8 g/dL (ref 30.0–36.0)
MCV: 95.5 fL (ref 80.0–100.0)
Monocytes Absolute: 0.7 10*3/uL (ref 0.1–1.0)
Monocytes Relative: 11 %
Neutro Abs: 4.4 10*3/uL (ref 1.7–7.7)
Neutrophils Relative %: 69 %
Platelets: 29 10*3/uL — CL (ref 150–400)
RBC: 3.54 MIL/uL — ABNORMAL LOW (ref 4.22–5.81)
RDW: 15.1 % (ref 11.5–15.5)
WBC: 6.2 10*3/uL (ref 4.0–10.5)
nRBC: 0 % (ref 0.0–0.2)

## 2019-09-01 LAB — AMMONIA: Ammonia: 163 umol/L — ABNORMAL HIGH (ref 9–35)

## 2019-09-01 LAB — GLUCOSE, CAPILLARY
Glucose-Capillary: 146 mg/dL — ABNORMAL HIGH (ref 70–99)
Glucose-Capillary: 134 mg/dL — ABNORMAL HIGH (ref 70–99)
Glucose-Capillary: 220 mg/dL — ABNORMAL HIGH (ref 70–99)
Glucose-Capillary: 233 mg/dL — ABNORMAL HIGH (ref 70–99)
Glucose-Capillary: 283 mg/dL — ABNORMAL HIGH (ref 70–99)

## 2019-09-01 LAB — PHENYTOIN LEVEL, TOTAL: Phenytoin Lvl: 16.8 ug/mL (ref 10.0–20.0)

## 2019-09-01 LAB — MAGNESIUM: Magnesium: 1.8 mg/dL (ref 1.7–2.4)

## 2019-09-01 MED ORDER — RIFAXIMIN 550 MG PO TABS
550.0000 mg | ORAL_TABLET | Freq: Two times a day (BID) | ORAL | Status: DC
  Administered 2019-09-01 – 2019-09-04 (×6): 550 mg via ORAL
  Filled 2019-09-01 (×6): qty 1

## 2019-09-01 MED ORDER — PHENOL 1.4 % MT LIQD
1.0000 | OROMUCOSAL | Status: DC | PRN
  Administered 2019-09-01: 1 via OROMUCOSAL
  Filled 2019-09-01: qty 177

## 2019-09-01 MED ORDER — LACTULOSE 10 GM/15ML PO SOLN: 30.0000 g | Freq: Four times a day (QID) | ORAL | Status: DC

## 2019-09-01 MED ORDER — LEVETIRACETAM 250 MG PO TABS
250.0000 mg | ORAL_TABLET | Freq: Two times a day (BID) | ORAL | Status: DC
  Administered 2019-09-01 – 2019-09-04 (×6): 250 mg via ORAL
  Filled 2019-09-01 (×6): qty 1

## 2019-09-01 MED ORDER — LACTULOSE 10 GM/15ML PO SOLN
30.0000 g | Freq: Every day | ORAL | Status: DC
  Administered 2019-09-01 – 2019-09-04 (×14): 30 g via ORAL
  Filled 2019-09-01 (×14): qty 60

## 2019-09-01 MED ORDER — LACTULOSE 10 GM/15ML PO SOLN
30.0000 g | Freq: Four times a day (QID) | ORAL | Status: DC
  Administered 2019-09-01: 30 g via ORAL
  Filled 2019-09-01: qty 60

## 2019-09-01 MED ORDER — FINASTERIDE 5 MG PO TABS
5.0000 mg | ORAL_TABLET | Freq: Every day | ORAL | Status: DC
  Administered 2019-09-01 – 2019-09-04 (×4): 5 mg via ORAL
  Filled 2019-09-01 (×4): qty 1

## 2019-09-01 NOTE — Progress Notes (Addendum)
Neurology Progress Note   S:// Seen and examined Wife in the room Wife very concerned about NG tube.  Did not want any kind of tubes.   O:// Current vital signs: BP 128/76 (BP Location: Right Arm)   Pulse 65   Temp 98.5 F (36.9 C) (Oral)   Resp 15   Ht 5\' 11"  (1.803 m)   Wt 93.6 kg   SpO2 100%   BMI 28.78 kg/m  Vital signs in last 24 hours: Temp:  [97.7 F (36.5 C)-98.6 F (37 C)] 98.5 F (36.9 C) (09/12 1700) Pulse Rate:  [58-65] 65 (09/12 1700) Resp:  [12-18] 15 (09/12 1700) BP: (128-159)/(75-85) 128/76 (09/12 1700) SpO2:  [99 %-100 %] 100 % (09/12 1700) Weight:  [93.6 kg] 93.6 kg (09/11 2000) Neurological exam Awake, alert of examination.  Could not tell me why he has-when asked whether he is in the hospital at home, he is at home. Follows commands purposefully Cranial nerves: Pupils equal round react light, extraocular muscle intact, mild right lower facial weakness.  Blinks to threat from both sides Motor exam: Antigravity in both upper and lower extremities-strength generally 4 to 4+/5 Tone is normal bulk is normal Sensation intact light touch Intact finger-nose-finger testing Gait testing deferred due to mentation  Medications  Current Facility-Administered Medications:  .  acetaminophen (TYLENOL) tablet 650 mg, 650 mg, Oral, Q6H PRN **OR** acetaminophen (TYLENOL) suppository 650 mg, 650 mg, Rectal, Q6H PRN, Shela Leff, MD .  hydrALAZINE (APRESOLINE) injection 5 mg, 5 mg, Intravenous, Q4H PRN, Shela Leff, MD, 5 mg at 09/01/19 0423 .  insulin aspart (novoLOG) injection 0-9 Units, 0-9 Units, Subcutaneous, Q4H, Shela Leff, MD, 3 Units at 09/01/19 1400 .  lactulose (CHRONULAC) 10 GM/15ML solution 30 g, 30 g, Oral, Q6H, Kamineni, Neelima, MD, 30 g at 09/01/19 1630 .  levETIRAcetam (KEPPRA) tablet 250 mg, 250 mg, Oral, BID, Amie Portland, MD .  phenol (CHLORASEPTIC) mouth spray 1 spray, 1 spray, Mouth/Throat, PRN, Guilford Shi, MD, 1  spray at 09/01/19 1400 .  rifaximin (XIFAXAN) tablet 550 mg, 550 mg, Oral, BID, Guilford Shi, MD Labs CBC    Component Value Date/Time   WBC 6.2 09/01/2019 0407   RBC 3.54 (L) 09/01/2019 0407   HGB 11.1 (L) 09/01/2019 0407   HCT 33.8 (L) 09/01/2019 0407   PLT 29 (LL) 09/01/2019 0407   MCV 95.5 09/01/2019 0407   MCH 31.4 09/01/2019 0407   MCHC 32.8 09/01/2019 0407   RDW 15.1 09/01/2019 0407   LYMPHSABS 1.0 09/01/2019 0407   MONOABS 0.7 09/01/2019 0407   EOSABS 0.2 09/01/2019 0407   BASOSABS 0.0 09/01/2019 0407    CMP     Component Value Date/Time   NA 140 09/01/2019 0407   K 4.3 09/01/2019 0407   CL 112 (H) 09/01/2019 0407   CO2 16 (L) 09/01/2019 0407   GLUCOSE 149 (H) 09/01/2019 0407   BUN 55 (H) 09/01/2019 0407   CREATININE 3.93 (H) 09/01/2019 0407   CALCIUM 8.8 (L) 09/01/2019 0407   PROT 6.8 09/01/2019 0407   ALBUMIN 2.7 (L) 09/01/2019 0407   AST 25 09/01/2019 0407   ALT 21 09/01/2019 0407   ALKPHOS 105 09/01/2019 0407   BILITOT 2.8 (H) 09/01/2019 0407   GFRNONAA 14 (L) 09/01/2019 0407   GFRAA 16 (L) 09/01/2019 0407   Imaging I have reviewed images in epic and the results pertinent to this consultation are: Head CT and MRI with stable left-sided 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  Assessment: 78 year old with  history of hypertension, diabetes, cirrhosis, left-sided subdural hematoma with recent admission in August at [facility] with worsening mentation over the past week Ammonia was elevated to 195.  Gradually trending down till yesterday, today up again. Creatinine also above baseline - trending slowly down. Has had progressive decline in mentation over the past weeks to months. Wife says the subdural has been chronic. Started on Dilantin for possible epileptogenic city in the left frontal area on EEG. Appears to be back to baseline per wife She reports every time his ammonia levels are elevated, he presents though he presented this time. Labs noted to have  worsening thrombocytopenia Dilantin changed to Keppra.  Impression: Toxic metabolic encephalopathy Left subdural hematoma Abnormal EEG with left-sided epileptogenicity, without seizures.  Recommendations: Continue Keppra at current dose-250 mg BID (renal dose) Correct toxic metabolic derangements Blood pressure control-goal less than 160 Management of thrombocytopenia per primary team Neurology will sign off and will be available as needed.  -- Amie Portland, MD [facility] Pager: [phone] If 7pm to 7am, please call on call as listed on AMION.

## 2019-09-01 NOTE — Progress Notes (Signed)
SLP Cancellation Note  Patient Details Name: Tosh Danesh MRN: [id number] DOB: 1941-07-02   Cancelled treatment:       Reason Eval/Treat Not Completed: Patient declined, no reason specified; family declined stating "he has had enough to drink, his stomach is hurting."   Elvina Sidle, M.S., CCC-SLP 09/01/2019, 2:36 PM

## 2019-09-01 NOTE — Progress Notes (Signed)
Paged [place], NP regarding critical platelet levels, continue to monitor pt

## 2019-09-01 NOTE — Progress Notes (Addendum)
Chart review note Worsening thrombocytopenia. Would stop dilantin. Would recommend primary team work up the thrombocytopenia. Also would monitor closely for any increase in size of Subdural hematoma. Will not be able to use Depakote due to cirrhosis and hyperammonemia. Would like to change to Keppra, due to CKD - use Keppra 250 BID starting tonight.  -- Amie Portland, MD Neurologist/Neurohospitalist

## 2019-09-02 DIAGNOSIS — D696 Thrombocytopenia, unspecified: Secondary | ICD-10-CM

## 2019-09-02 DIAGNOSIS — R4182 Altered mental status, unspecified: Secondary | ICD-10-CM

## 2019-09-02 DIAGNOSIS — K7460 Unspecified cirrhosis of liver: Secondary | ICD-10-CM

## 2019-09-02 LAB — BASIC METABOLIC PANEL
Anion gap: 11 (ref 5–15)
BUN: 53 mg/dL — ABNORMAL HIGH (ref 8–23)
CO2: 14 mmol/L — ABNORMAL LOW (ref 22–32)
Calcium: 8.5 mg/dL — ABNORMAL LOW (ref 8.9–10.3)
Chloride: 117 mmol/L — ABNORMAL HIGH (ref 98–111)
Creatinine, Ser: 3.46 mg/dL — ABNORMAL HIGH (ref 0.61–1.24)
GFR calc Af Amer: 18 mL/min — ABNORMAL LOW (ref >60)
GFR calc non Af Amer: 16 mL/min — ABNORMAL LOW (ref >60)
Glucose, Bld: 170 mg/dL — ABNORMAL HIGH (ref 70–99)
Potassium: 3.6 mmol/L (ref 3.5–5.1)
Sodium: 142 mmol/L (ref 135–145)

## 2019-09-02 LAB — CBC
HCT: 32.2 % — ABNORMAL LOW (ref 39.0–52.0)
Hemoglobin: 10.7 g/dL — ABNORMAL LOW (ref 13.0–17.0)
MCH: 31.3 pg (ref 26.0–34.0)
MCHC: 33.2 g/dL (ref 30.0–36.0)
MCV: 94.2 fL (ref 80.0–100.0)
Platelets: 40 10*3/uL — ABNORMAL LOW (ref 150–400)
RBC: 3.42 MIL/uL — ABNORMAL LOW (ref 4.22–5.81)
RDW: 15.1 % (ref 11.5–15.5)
WBC: 6.6 10*3/uL (ref 4.0–10.5)
nRBC: 0 % (ref 0.0–0.2)

## 2019-09-02 LAB — PLATELET ANTIBODY PROFILE
Glycoprotein IV Antibody: NEGATIVE
HLA Ab Ser Ql EIA: POSITIVE — AB
IB/IX Antibody: NEGATIVE
IIB/IIIA Antibody: POSITIVE — AB

## 2019-09-02 LAB — GLUCOSE, CAPILLARY
Glucose-Capillary: 182 mg/dL — ABNORMAL HIGH (ref 70–99)
Glucose-Capillary: 137 mg/dL — ABNORMAL HIGH (ref 70–99)
Glucose-Capillary: 227 mg/dL — ABNORMAL HIGH (ref 70–99)
Glucose-Capillary: 242 mg/dL — ABNORMAL HIGH (ref 70–99)
Glucose-Capillary: 185 mg/dL — ABNORMAL HIGH (ref 70–99)
Glucose-Capillary: 229 mg/dL — ABNORMAL HIGH (ref 70–99)

## 2019-09-02 LAB — AMMONIA: Ammonia: 193 umol/L — ABNORMAL HIGH (ref 9–35)

## 2019-09-02 MED ORDER — LOSARTAN POTASSIUM 25 MG PO TABS
25.0000 mg | ORAL_TABLET | Freq: Every day | ORAL | Status: DC
  Administered 2019-09-02 – 2019-09-04 (×3): 25 mg via ORAL
  Filled 2019-09-02 (×3): qty 1

## 2019-09-02 MED ORDER — LACTULOSE 10 GM/15ML PO SOLN
30.0000 g | Freq: Once | ORAL | Status: AC
  Administered 2019-09-02: 30 g via ORAL
  Filled 2019-09-02: qty 60

## 2019-09-02 NOTE — Progress Notes (Signed)
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 In the ED, patient was noted to have right-sided weakness. Code stroke activated. Neurology consulted. He was also diagnosed with liver cirrhosis 3 years ago.  According to her, she has been giving him lactulose every 5 hours religiously and that he has couple of bowel movements every day.  Patient was found to have significantly elevated ammonia level around 195-->now84. COVID negative.  Rest of the lab work was unremarkable.  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  No new hemorrhage.  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 Per neurology,do not anticipate that he will be completely normal and would require time for improvement.They feel his seizure risk is adequately controlled with Dilantin. They recommend correction of metabolic derangements/ammonia and SBP to be<160.   Subjective: Patient seen this morning in rounds along with bedside nurse. Wife stayed overnight and is bedside this morning. She states patient is better today but still hallucinates at times. Neurosurgery also stopped by while I was in the room. Patient is sleeping but easily arousable and oriented to person (was able to name himself,wife and children) and place (was able to state that he was in a  hospital at [facility]) and partially oriented to time. Off NG tube.   Objective: Vitals:   09/02/19 0042 09/02/19 0405 09/02/19 0800 09/02/19 1100  BP: (!) 164/79 (!) 146/77 134/71 133/84  Pulse: 61 68 68 67  Resp: 18 18 12 14   Temp: 98.4 F (36.9 C) 98.3 F (36.8 C) 98 F (36.7 C) 98.2 F (36.8 C)  TempSrc: Oral Oral Axillary Oral  SpO2: 100% 99% 99% 100%  Weight:      Height:        Intake/Output Summary (Last 24 hours) at 09/02/2019 1429 Last data filed at 09/02/2019 0454 Gross per 24 hour  Intake --  Output 650 ml  Net -650 ml   Filed Weights   08/31/19 2000  Weight: 93.6 kg    Physical Examination:  General exam: Appears calm and comfortable  Respiratory system: Clear to auscultation. Respiratory effort normal. Cardiovascular system: S1 & S2 heard, RRR. No JVD. No pedal edema. Gastrointestinal system: Abdomen is nondistended, soft and nontender. No ascitis/ organomegaly or masses felt. Normal bowel sounds heard.  Condom catheter in place Central nervous system: Alert and oriented as discussed above. No focal neurological deficits. Skin: No rashes, lesions or ulcers Psychiatry: Judgement and insight appear to be improving. Mood & affect appropriate.     Data Reviewed: I have personally reviewed following labs and imaging studies  CBC: Recent Labs  Lab 08/30/19 1734 08/31/19 0508 09/01/19 0407 09/02/19 0353  WBC 5.6 8.9 6.2 6.6  NEUTROABS 3.8  --  4.4  --   HGB 11.5* 13.3 11.1* 10.7*  HCT 35.2* 38.9* 33.8* 32.2*  MCV 96.7 92.8 95.5 94.2  PLT 61* 45* 29* 40*   Basic Metabolic  Panel: Recent Labs  Lab 08/30/19 1734 08/31/19 0508 09/01/19 0407 09/02/19 0353  NA 139 140 140 142  K 4.0 4.3 4.3 3.6  CL 117* 113* 112* 117*  CO2 12* 16* 16* 14*  GLUCOSE 127* 144* 149* 170*  BUN 51* 53* 55* 53*  CREATININE 4.82* 4.63* 3.93* 3.46*  CALCIUM 9.3 9.4 8.8* 8.5*  MG  --   --  1.8  --    GFR: Estimated Creatinine Clearance: 20.2 mL/min (A) (by C-G formula  based on SCr of 3.46 mg/dL (H)). Liver Function Tests: Recent Labs  Lab 08/30/19 1734 08/31/19 0508 09/01/19 0407  AST 21 20 25   ALT 23 24 21   ALKPHOS 113 107 105  BILITOT 2.1* 2.7* 2.8*  PROT 7.2 7.0 6.8  ALBUMIN 2.8* 2.9* 2.7*   No results for input(s): LIPASE, AMYLASE in the last 168 hours. Recent Labs  Lab 08/30/19 1735 08/31/19 0508 09/01/19 1335 09/02/19 0353  AMMONIA 195* 84* 163* 193*   Coagulation Profile: Recent Labs  Lab 08/30/19 1734  INR 1.6*   Cardiac Enzymes: No results for input(s): CKTOTAL, CKMB, CKMBINDEX, TROPONINI in the last 168 hours. BNP (last 3 results) No results for input(s): PROBNP in the last 8760 hours. HbA1C: No results for input(s): HGBA1C in the last 72 hours. CBG: Recent Labs  Lab 09/01/19 2030 09/02/19 0046 09/02/19 0330 09/02/19 0854 09/02/19 1218  GLUCAP 283* 182* 137* 227* 242*   Lipid Profile: No results for input(s): CHOL, HDL, LDLCALC, TRIG, CHOLHDL, LDLDIRECT in the last 72 hours. Thyroid Function Tests: No results for input(s): TSH, T4TOTAL, FREET4, T3FREE, THYROIDAB in the last 72 hours. Anemia Panel: No results for input(s): VITAMINB12, FOLATE, FERRITIN, TIBC, IRON, RETICCTPCT in the last 72 hours. Sepsis Labs: Recent Labs  Lab 08/30/19 1737  LATICACIDVEN 2.0*    Recent Results (from the past 240 hour(s))  SARS Coronavirus 2 [facility] order, Performed in [facility] hospital lab) Nasopharyngeal Nasopharyngeal Swab     Status: None   Collection Time: 08/30/19  8:21 PM   Specimen: Nasopharyngeal Swab  Result Value Ref Range Status   SARS Coronavirus 2 NEGATIVE NEGATIVE Final    Comment: (NOTE) If result is NEGATIVE SARS-CoV-2 target nucleic acids are NOT DETECTED. The SARS-CoV-2 RNA is generally detectable in upper and lower  respiratory specimens during the acute phase of infection. The lowest  concentration of SARS-CoV-2 viral copies this assay can detect is 250  copies / mL. A negative result does not  preclude SARS-CoV-2 infection  and should not be used as the sole basis for treatment or other  patient management decisions.  A negative result may occur with  improper specimen collection / handling, submission of specimen other  than nasopharyngeal swab, presence of viral mutation(s) within the  areas targeted by this assay, and inadequate number of viral copies  (<250 copies / mL). A negative result must be combined with clinical  observations, patient history, and epidemiological information. If result is POSITIVE SARS-CoV-2 target nucleic acids are DETECTED. The SARS-CoV-2 RNA is generally detectable in upper and lower  respiratory specimens dur ing the acute phase of infection.  Positive  results are indicative of active infection with SARS-CoV-2.  Clinical  correlation with patient history and other diagnostic information is  necessary to determine patient infection status.  Positive results do  not rule out bacterial infection or co-infection with other viruses. If result is PRESUMPTIVE POSTIVE SARS-CoV-2 nucleic acids MAY BE PRESENT.   A presumptive positive result was obtained on  the submitted specimen  and confirmed on repeat testing.  While 2019 novel coronavirus  (SARS-CoV-2) nucleic acids may be present in the submitted sample  additional confirmatory testing may be necessary for epidemiological  and / or clinical management purposes  to differentiate between  SARS-CoV-2 and other Sarbecovirus currently known to infect humans.  If clinically indicated additional testing with an alternate test  methodology [phone]) is advised. The SARS-CoV-2 RNA is generally  detectable in upper and lower respiratory sp ecimens during the acute  phase of infection. The expected result is Negative. Fact Sheet for Patients:  StrictlyIdeas.no Fact Sheet for Healthcare Providers: BankingDealers.co.za This test is not yet approved or cleared by  the [place] FDA and has been authorized for detection and/or diagnosis of SARS-CoV-2 by FDA under an Emergency Use Authorization (EUA).  This EUA will remain in effect (meaning this test can be used) for the duration of the COVID-19 declaration under Section 564(b)(1) of the Act, 21 U.S.C. section 360bbb-3(b)(1), unless the authorization is terminated or revoked sooner. Performed at [facility], [place] [address], [place], [place] [address]       Radiology Studies: Mr Brain 97 Contrast  Result Date: 08/31/2019 CLINICAL DATA:  78 year old male with complicated diabetes. Subdural hematoma in July this year. Altered mental status, encephalopathy now. EXAM: MRI HEAD WITHOUT CONTRAST TECHNIQUE: Multiplanar, multiecho pulse sequences of the brain and surrounding structures were obtained without intravenous contrast. COMPARISON:  Head CT 08/30/2019. FINDINGS: Brain: Mixed signal biconvex left side subdural hematoma measures up to 13 millimeters in thickness on series 9, image 15. Stable mild mass effect on the left hemisphere. Only trace rightward midline shift. Basilar cisterns remain patent. Fairly numerous scattered chronic microhemorrhages in the brain which are most concentrated in the bilateral deep gray nuclei (series 3, image 56) and the brainstem. No intraventricular or other extra-axial hemorrhage. No restricted diffusion to suggest acute infarction. No ventriculomegaly. Cervicomedullary junction and pituitary are within normal limits. Patchy and confluent bilateral cerebral white matter T2 and FLAIR hyperintensity. No definite cortical encephalomalacia. Chronic deep gray nuclei lacunar infarcts. Linear Wallerian degeneration or chronic ischemia in the pons on series 7, image 8. Negative cerebellum. Vascular: Loss of the distal left vertebral artery flow void on series 7, image 3. Other Major intracranial vascular flow voids are preserved. Skull and upper cervical spine: Negative visible  cervical spine. Normal bone marrow signal. Sinuses/Orbits: Negative orbits. Trace to mild paranasal sinus mucosal thickening. Other: Mastoids remain clear. There is a left side nasoenteric tube in place. IMPRESSION: 1. Left side subdural hematoma with mixed signal measures up to 13 mm in thickness and is stable from the CT yesterday. Mild mass effect on the left hemisphere is unchanged. 2. No other acute intracranial abnormality. 3. Underlying advanced chronic micro-hemorrhages in the deep gray nuclei, and to a lesser extent elsewhere in the brain. 4. Advanced cerebral white matter signal changes also likely small vessel disease related. Electronically Signed   By: Genevie Ann M.D.   On: 08/31/2019 16:59   US Abdomen Complete  Result Date: 08/31/2019 CLINICAL DATA:  Elevated liver function tests. EXAM: ABDOMEN ULTRASOUND COMPLETE COMPARISON:  Ultrasound of October 26, 2017. FINDINGS: Gallbladder: 2.3 cm gallstone is noted. Moderate gallbladder wall thickening is noted focally with possible 8 mm cyst. No sonographic Murphy's sign or pericholecystic fluid is noted. Common bile duct: Diameter: 4 mm which is within normal limits. Liver: No focal lesion identified. Within normal limits in parenchymal echogenicity. Portal vein is patent  on color Doppler imaging with normal direction of blood flow towards the liver. IVC: No abnormality visualized. Pancreas: Not visualized due to overlying bowel gas. Spleen: Size and appearance within normal limits. Right Kidney: Length: 9.2 cm. Echogenicity within normal limits. No mass or hydronephrosis visualized. Left Kidney: Length: 10.5 cm. 1 cm cyst is noted. Echogenicity within normal limits. No mass or hydronephrosis visualized. Abdominal aorta: No aneurysm visualized. Other findings: None. IMPRESSION: Cholelithiasis is noted with focal gallbladder wall thickening and probable 8 mm cyst seen within gallbladder wall. If there is clinical concern for cholecystitis, HIDA scan is  recommended for further evaluation. Pancreas not visualized due to overlying bowel gas. Electronically Signed   By: Marijo Conception M.D.   On: 08/31/2019 16:48        Scheduled Meds:  finasteride  5 mg Oral Daily   insulin aspart  0-9 Units Subcutaneous Q4H   lactulose  30 g Oral 5 X Daily   levETIRAcetam  250 mg Oral BID   rifaximin  550 mg Oral BID   Continuous Infusions:   Assessment & Plan:    1.  Acute metabolic encephalopathy: Present on admission secondary to seizure versus hepatic encephalopathy. CT head/MRI head did not show any worsening of old SDH. Seen by neurology and was started on Dilantin on admission--changed to Prue on 9/12 in concern for thrombocytopenia.  Patient started on lactulose and received a dose on 9/10.  Noted that lactulose order was "PRN" and didn't receive any on 9/11.  Changed to scheduled dosing on 9/12.  Appears to be mentating better overall. Tolerating p.o. intake and off NG tube. Improving slowly , though still has reversed sleep pattern and intermittent hallucinations.   2.  Decompensated liver cirrhosis/hepatic encephalopathy: As mentioned above lactulose changed to scheduled dosing.  His ammonia levels have been 195->84-->163-->193 today. D/W nurse to ensure timely administration of Lactulose Q5hrs like he takes at home. He will need additional dosing PRN. Patient also has pancytopenia/severe thrombocytopenia at baseline.  Will need to monitor in the setting of SDH.  Patient advanced liver disease/Child's criteria C (total score of 12) given elevated bilirubin, elevated PT/INR,hypoalbuminemia and encephalopathy indicating 35% survival rate-2 yrs. Currently Lasix/Aldactone on hold in concern for elevated creatinine.  Continue lactulose/rifaximin  3. Subacute-chronic left subdural hematoma: Appears to be slightly smaller on current CT.  No new hemorrhage on CT/MRI. Holding antiplatelet/anticoagulation.  D/W neurology Dr Rory Percy and Neurosurgery who  do not feel he is a candidate for intervention especially with thrombocytopenia. Left-sided epileptogenicity without seizures on EEG.  Patient was discharged on prophylactic antiepileptics -500 mg twice daily Keppra from [place] in August.  It appears that wife stopped giving these medications on the advice of?  [place] home health RN.  Patient was started on Dilantin in the ED (preferred given concern for renal dysfunction per neuro).  This has however been changed to renal dose of Keppra-250 mg twice daily now in concern for worsening thrombocytopenia.Will monitor mental status changes closely and repeat CT head if needed, given risk of spontaneous bleeding with significantly low platelet levels.  4.  Acute on chronic thrombocytopenia: Patient appears to have received platelet transfusions in the setting of SDH at [facility] in July (per wife this was a spontaneous bleed and not traumatic). Consulted hematology regarding concerning levels at 29 yesterday-->spontaneously improved to 40 today.They do not recommend platelet transfusions. No splenomegaly on USG abdomen.Dilantin changed to Mango today by neurology.  Not on any other medications to cause thrombocytopenia.  Platelet antibodies sent 9/12.  5.  Acute on CKD stage IV: Creatinine level appears to be slowly improving 4.8--> 3.9-->3.4 today.  Home medications including losartan/Lasix/Aldactone have been on hold.  SBP fluctuating 1 20-1 40s.  Will resume losartan at a lower dose as blood pressure rising. Resume diuretics slowly while monitoring renal function. No ascitis on USG/no pedal edema   6.  Dysphagia: In the setting of problem #1.  Improving with improving mental status.  Transitioned to p.o. diet/p.o. meds as tolerated.  7.Type 2 diabetes mellitus: Reportedly his last hemoglobin A1c was 7.3 on 06/26/2019 per [facility] records.  Blood sugar control.  Continue SSI.  8. ?  BPH: Resumed home medications  DVT prophylaxis: Avoid anticoagulants given  thrombocytopenia Code Status: DNR Family / Patient Communication: Discussed with wife yesterday and again today in detail.  Wife remains upset about patient not getting lactulose on Friday.  She was again reassured that lactulose is now changed to scheduled dosing . She was also reassured that patient's mental status is slowly improving which would be a better indicator than ammonia level itself.  She was also informed of the critical thrombocytopenia, change in antiepileptic regimen, rationale for doing so and risk of worsening SDH. Neurosurgery also discussed with her. Wife is very difficult to communicate with and gets agitated at times when discussing issues with providers.  Irrespectively, she was reassured with lengthy discussions yesterday and today. I also discussed with bedside nurse again today regarding timely lactulose administration and close observation of mental status given above concerns.Nursing staff has been updating wife periodically and she appears overall satisfied today. She plans to go home to get some rest this afternoon. I also discussed with neurosurgery and hematology. Disposition Plan: Home when medically cleared.     LOS: 3 days    Time spent: 60 minutes    Guilford Shi, MD [facility] Pager [phone]  If 7PM-7AM, please contact night-coverage [email or web address] Password [facility] 09/02/2019, 2:29 PM

## 2019-09-02 NOTE — Plan of Care (Signed)
Patient stable, A+Ox4, taking PO rx as ordered. Discussed POC with patient and spouse, agreeable with plan, denies question/concerns at this time.   Problem: Health Behavior/Discharge Planning: Goal: Ability to manage health-related needs will improve Outcome: Progressing

## 2019-09-02 NOTE — Consult Note (Signed)
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  His wife agrees that no further hematology follow-up is needed Please call if questions arise  Heath Lark, MD 09/02/2019 10:22 AM    CHIEF COMPLAINTS/PURPOSE OF CONSULTATION:  Acute on chronic thrombocytopenia, for further evaluation  HISTORY OF PRESENTING ILLNESS:  Dennis Mcneil 78 y.o. male is seen at the request of hospitalist for evaluation of thrombocytopenia The patient is not able to give much history His wife is an excellent historian She told me that he is known to have chronic thrombocytopenia for over 20 years She is aware that this is related to his baseline liver cirrhosis.  He had received platelet transfusion many years ago but none lately. His baseline platelet count on August 30, 2019 was 61,000.  It got steadily worse, down to 29,000 but bounced back to 40,000 this morning. According to results from care everywhere, he was admitted to [facility] in August.  His baseline platelet count on discharge is 44,000 He denies recent  bruising/bleeding, such as spontaneous epistaxis, hematuria, melena or hematochezia His wife is very concerned about his hepatic encephalopathy, typically occurs when his ammonia level is elevated.  He is on a very strict regimen of lactulose 4-5 times a day The patient had history of subdural hematoma.  On admission, due to altered mental status, he had repeat imaging studies. His altered mental status has improved this morning He was placed on Dilantin for seizure.  That subsequently got discontinued and he is currently on Keppra.  MEDICAL HISTORY:  Past Medical History:  Diagnosis Date  . Diabetes mellitus without complication ([facility])   . Hypertension     SURGICAL HISTORY: No past surgical history on file.  SOCIAL HISTORY: Social History   Socioeconomic History  . Marital status: Married    Spouse name: Not on file  . Number of children: Not on file  . Years of education: Not on file  . Highest education level: Not on file  Occupational History  . Not on file  Social Needs  . Financial resource strain: Not on file  . Food insecurity    Worry: Not on file    Inability: Not on file  . Transportation needs    Medical: Not on file    Non-medical: Not on file  Tobacco Use  . Smoking status: Never Smoker  Substance and Sexual Activity  . Alcohol use: Yes  . Drug use: Not on file  . Sexual activity: Not on file  Lifestyle  . Physical activity    Days per week: Not on file    Minutes per session: Not on file  .  Stress: Not on file  Relationships  . Social Herbalist on phone: Not on file    Gets together: Not on file    Attends religious service: Not on file    Active member of club or organization: Not on file    Attends meetings of clubs or organizations: Not on file    Relationship status: Not on file  . Intimate partner violence    Fear of current or ex partner: Not on file    Emotionally abused: Not on file    Physically abused: Not on file    Forced  sexual activity: Not on file  Other Topics Concern  . Not on file  Social History Narrative  . Not on file    FAMILY HISTORY: History reviewed. No pertinent family history.  ALLERGIES:  is allergic to vitamin b12.  MEDICATIONS:  Current Facility-Administered Medications  Medication Dose Route Frequency Provider Last Rate Last Dose  . acetaminophen (TYLENOL) tablet 650 mg  650 mg Oral Q6H PRN Shela Leff, MD       Or  . acetaminophen (TYLENOL) suppository 650 mg  650 mg Rectal Q6H PRN Shela Leff, MD      . finasteride (PROSCAR) tablet 5 mg  5 mg Oral Daily Guilford Shi, MD   5 mg at 09/02/19 1017  . hydrALAZINE (APRESOLINE) injection 5 mg  5 mg Intravenous Q4H PRN Shela Leff, MD   5 mg at 09/02/19 0057  . insulin aspart (novoLOG) injection 0-9 Units  0-9 Units Subcutaneous Q4H Shela Leff, MD   3 Units at 09/02/19 0900  . lactulose (CHRONULAC) 10 GM/15ML solution 30 g  30 g Oral 5 X Daily Guilford Shi, MD   30 g at 09/02/19 1017  . levETIRAcetam (KEPPRA) tablet 250 mg  250 mg Oral BID Amie Portland, MD   250 mg at 09/02/19 1017  . phenol (CHLORASEPTIC) mouth spray 1 spray  1 spray Mouth/Throat PRN Guilford Shi, MD   1 spray at 09/01/19 1400  . rifaximin (XIFAXAN) tablet 550 mg  550 mg Oral BID Guilford Shi, MD   550 mg at 09/02/19 1017    REVIEW OF SYSTEMS:   Constitutional: Denies fevers, chills or abnormal night sweats Eyes: Denies blurriness of vision, double vision or watery eyes Ears, nose, mouth, throat, and face: Denies mucositis or sore throat Respiratory: Denies cough, dyspnea or wheezes Cardiovascular: Denies palpitation, chest discomfort or lower extremity swelling Gastrointestinal:  Denies nausea, heartburn or change in bowel habits Skin: Denies abnormal skin rashes  Lymphatics: Denies new lymphadenopathy or easy bruising All other systems were reviewed with the patient and are negative.  PHYSICAL EXAMINATION: ECOG  PERFORMANCE STATUS: 3 - Symptomatic, >50% confined to bed  Vitals:   09/02/19 0405 09/02/19 0800  BP: (!) 146/77 134/71  Pulse: 68 68  Resp: 18 12  Temp: 98.3 F (36.8 C) 98 F (36.7 C)  SpO2: 99% 99%   Filed Weights   08/31/19 2000  Weight: 206 lb 5.6 oz (93.6 kg)    GENERAL:alert, no distress and comfortable.  He appears alert, understands simple commands. SKIN: skin color, texture, turgor are normal, no rashes or significant lesions EYES: normal, conjunctiva are pink and non-injected, sclera clear OROPHARYNX:no exudate, no erythema and lips, buccal mucosa, and tongue normal  NECK: supple, thyroid normal size, non-tender, without nodularity LYMPH:  no palpable lymphadenopathy in the cervical, axillary or inguinal LUNGS: clear to auscultation and percussion with normal breathing effort HEART: regular rate &  rhythm and no murmurs and no lower extremity edema ABDOMEN:abdomen soft,,distended, non-tender and normal bowel sounds Musculoskeletal:no cyanosis of digits and no clubbing  PSYCH: alert & oriented x 3 with fluent speech NEURO: no focal motor/sensory deficits  LABORATORY DATA:  I have reviewed the data as listed Lab Results  Component Value Date   WBC 6.6 09/02/2019   HGB 10.7 (L) 09/02/2019   HCT 32.2 (L) 09/02/2019   MCV 94.2 09/02/2019   PLT 40 (L) 09/02/2019   I have reviewed his peripheral blood smear.  Absolute thrombocytopenia is seen without any evidence of clumping or schistocytes  RADIOGRAPHIC STUDIES: I have personally reviewed the radiological images as listed and agreed with the findings in the report. Mr Brain 39 Contrast  Result Date: 08/31/2019 CLINICAL DATA:  78 year old male with complicated diabetes. Subdural hematoma in July this year. Altered mental status, encephalopathy now. EXAM: MRI HEAD WITHOUT CONTRAST TECHNIQUE: Multiplanar, multiecho pulse sequences of the brain and surrounding structures were obtained without intravenous contrast.  COMPARISON:  Head CT 08/30/2019. FINDINGS: Brain: Mixed signal biconvex left side subdural hematoma measures up to 13 millimeters in thickness on series 9, image 15. Stable mild mass effect on the left hemisphere. Only trace rightward midline shift. Basilar cisterns remain patent. Fairly numerous scattered chronic microhemorrhages in the brain which are most concentrated in the bilateral deep gray nuclei (series 3, image 56) and the brainstem. No intraventricular or other extra-axial hemorrhage. No restricted diffusion to suggest acute infarction. No ventriculomegaly. Cervicomedullary junction and pituitary are within normal limits. Patchy and confluent bilateral cerebral white matter T2 and FLAIR hyperintensity. No definite cortical encephalomalacia. Chronic deep gray nuclei lacunar infarcts. Linear Wallerian degeneration or chronic ischemia in the pons on series 7, image 8. Negative cerebellum. Vascular: Loss of the distal left vertebral artery flow void on series 7, image 3. Other Major intracranial vascular flow voids are preserved. Skull and upper cervical spine: Negative visible cervical spine. Normal bone marrow signal. Sinuses/Orbits: Negative orbits. Trace to mild paranasal sinus mucosal thickening. Other: Mastoids remain clear. There is a left side nasoenteric tube in place. IMPRESSION: 1. Left side subdural hematoma with mixed signal measures up to 13 mm in thickness and is stable from the CT yesterday. Mild mass effect on the left hemisphere is unchanged. 2. No other acute intracranial abnormality. 3. Underlying advanced chronic micro-hemorrhages in the deep gray nuclei, and to a lesser extent elsewhere in the brain. 4. Advanced cerebral white matter signal changes also likely small vessel disease related. Electronically Signed   By: Genevie Ann M.D.   On: 08/31/2019 16:59   US Abdomen Complete  Result Date: 08/31/2019 CLINICAL DATA:  Elevated liver function tests. EXAM: ABDOMEN ULTRASOUND COMPLETE  COMPARISON:  Ultrasound of October 26, 2017. FINDINGS: Gallbladder: 2.3 cm gallstone is noted. Moderate gallbladder wall thickening is noted focally with possible 8 mm cyst. No sonographic Murphy's sign or pericholecystic fluid is noted. Common bile duct: Diameter: 4 mm which is within normal limits. Liver: No focal lesion identified. Within normal limits in parenchymal echogenicity. Portal vein is patent on color Doppler imaging with normal direction of blood flow towards the liver. IVC: No abnormality visualized. Pancreas: Not visualized due to overlying bowel gas. Spleen: Size and appearance within normal limits. Right Kidney: Length: 9.2 cm. Echogenicity within normal limits. No mass or hydronephrosis visualized. Left Kidney: Length: 10.5 cm. 1 cm cyst is noted. Echogenicity within normal limits. No mass or hydronephrosis visualized. Abdominal aorta: No aneurysm visualized. Other findings: None. IMPRESSION:  Cholelithiasis is noted with focal gallbladder wall thickening and probable 8 mm cyst seen within gallbladder wall. If there is clinical concern for cholecystitis, HIDA scan is recommended for further evaluation. Pancreas not visualized due to overlying bowel gas. Electronically Signed   By: Marijo Conception M.D.   On: 08/31/2019 16:48   Dg Chest Portable 1 View  Result Date: 08/31/2019 CLINICAL DATA:  MRI clearance EXAM: PORTABLE CHEST 1 VIEW COMPARISON:  None. FINDINGS: NG tube enters the stomach. Cardiomegaly with vascular congestion. No confluent opacities, effusions or edema. No radiopaque foreign bodies. No pacer or other cardiac device seen. IMPRESSION: Cardiomegaly, vascular congestion. Electronically Signed   By: Rolm Baptise M.D.   On: 08/31/2019 00:22   Dg Abd Portable 1 View  Result Date: 08/30/2019 CLINICAL DATA:  NG tube placement EXAM: PORTABLE ABDOMEN - 1 VIEW COMPARISON:  06/25/2019 FINDINGS: Esophagogastric tube is positioned with tip and side port below the diaphragm, tip positioned  over the gastric body. Nonobstructive pattern of partially imaged bowel gas. IMPRESSION: Esophagogastric tube is positioned with tip and side port below the diaphragm, tip positioned over the gastric body. Electronically Signed   By: Eddie Candle M.D.   On: 08/30/2019 20:27   Ct Head Code Stroke Wo Contrast  Result Date: 08/30/2019 CLINICAL DATA:  Code stroke.  Altered level of consciousness. EXAM: CT HEAD WITHOUT CONTRAST TECHNIQUE: Contiguous axial images were obtained from the base of the skull through the vertex without intravenous contrast. COMPARISON:  CT head 07/29/2019, 06/25/2019 FINDINGS: Brain: Low-density subdural hematoma on the left measures 13 mm in thickness on coronal images. This is mildly improved in size since the prior study. High density blood products seen on the prior study in the subdural hematoma have resolved. No acute or new hemorrhage. 2 mm midline shift to the right unchanged. Ventricle size normal. Chronic microvascular ischemic changes in the white matter and left basal ganglia stable. No acute infarct or mass. Vascular: Atherosclerotic calcification distal vertebral arteries and bilateral carotid arteries. Skull: Negative Sinuses/Orbits: Mild mucosal edema paranasal sinuses.  Normal orbit. Other: None ASPECTS ([place] Stroke Program Early CT Score) - Ganglionic level infarction (caudate, lentiform nuclei, internal capsule, insula, M1-M3 cortex): 7 - Supraganglionic infarction (M4-M6 cortex): 3 Total score (0-10 with 10 being normal): 10 IMPRESSION: 1. 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 No new area of hemorrhage. 2. No acute ischemic infarct. 3. ASPECTS is 10 4. These results were called by telephone at the time of interpretation on 08/30/2019 at 6:01 pm to provider Rory Percy , who verbally acknowledged these results. Electronically Signed   By: Franchot Gallo M.D.   On: 08/30/2019 18:02

## 2019-09-02 NOTE — Progress Notes (Signed)
Subjective: Patient reports stable  Objective: Vital signs in last 24 hours: Temp:  [97.7 F (36.5 C)-98.5 F (36.9 C)] 98 F (36.7 C) (09/13 0800) Pulse Rate:  [61-68] 68 (09/13 0800) Resp:  [12-18] 12 (09/13 0800) BP: (128-164)/(71-80) 134/71 (09/13 0800) SpO2:  [99 %-100 %] 99 % (09/13 0800)  Intake/Output from previous day: 09/12 0701 - 09/13 0700 In: -  Out: 650 [Urine:650] Intake/Output this shift: No intake/output data recorded.  Physical Exam: Sleepy, arouses with stimulation  Lab Results: Recent Labs    09/01/19 0407 09/02/19 0353  WBC 6.2 6.6  HGB 11.1* 10.7*  HCT 33.8* 32.2*  PLT 29* 40*   BMET Recent Labs    09/01/19 0407 09/02/19 0353  NA 140 142  K 4.3 3.6  CL 112* 117*  CO2 16* 14*  GLUCOSE 149* 170*  BUN 55* 53*  CREATININE 3.93* 3.46*  CALCIUM 8.8* 8.5*    Studies/Results: Mr Brain Wo Contrast  Result Date: 08/31/2019 CLINICAL DATA:  78 year old male with complicated diabetes. Subdural hematoma in July this year. Altered mental status, encephalopathy now. EXAM: MRI HEAD WITHOUT CONTRAST TECHNIQUE: Multiplanar, multiecho pulse sequences of the brain and surrounding structures were obtained without intravenous contrast. COMPARISON:  Head CT 08/30/2019. FINDINGS: Brain: Mixed signal biconvex left side subdural hematoma measures up to 13 millimeters in thickness on series 9, image 15. Stable mild mass effect on the left hemisphere. Only trace rightward midline shift. Basilar cisterns remain patent. Fairly numerous scattered chronic microhemorrhages in the brain which are most concentrated in the bilateral deep gray nuclei (series 3, image 56) and the brainstem. No intraventricular or other extra-axial hemorrhage. No restricted diffusion to suggest acute infarction. No ventriculomegaly. Cervicomedullary junction and pituitary are within normal limits. Patchy and confluent bilateral cerebral white matter T2 and FLAIR hyperintensity. No definite cortical  encephalomalacia. Chronic deep gray nuclei lacunar infarcts. Linear Wallerian degeneration or chronic ischemia in the pons on series 7, image 8. Negative cerebellum. Vascular: Loss of the distal left vertebral artery flow void on series 7, image 3. Other Major intracranial vascular flow voids are preserved. Skull and upper cervical spine: Negative visible cervical spine. Normal bone marrow signal. Sinuses/Orbits: Negative orbits. Trace to mild paranasal sinus mucosal thickening. Other: Mastoids remain clear. There is a left side nasoenteric tube in place. IMPRESSION: 1. Left side subdural hematoma with mixed signal measures up to 13 mm in thickness and is stable from the CT yesterday. Mild mass effect on the left hemisphere is unchanged. 2. No other acute intracranial abnormality. 3. Underlying advanced chronic micro-hemorrhages in the deep gray nuclei, and to a lesser extent elsewhere in the brain. 4. Advanced cerebral white matter signal changes also likely small vessel disease related. Electronically Signed   By: Genevie Ann M.D.   On: 08/31/2019 16:59   US Abdomen Complete  Result Date: 08/31/2019 CLINICAL DATA:  Elevated liver function tests. EXAM: ABDOMEN ULTRASOUND COMPLETE COMPARISON:  Ultrasound of October 26, 2017. FINDINGS: Gallbladder: 2.3 cm gallstone is noted. Moderate gallbladder wall thickening is noted focally with possible 8 mm cyst. No sonographic Murphy's sign or pericholecystic fluid is noted. Common bile duct: Diameter: 4 mm which is within normal limits. Liver: No focal lesion identified. Within normal limits in parenchymal echogenicity. Portal vein is patent on color Doppler imaging with normal direction of blood flow towards the liver. IVC: No abnormality visualized. Pancreas: Not visualized due to overlying bowel gas. Spleen: Size and appearance within normal limits. Right Kidney: Length: 9.2 cm. Echogenicity within  normal limits. No mass or hydronephrosis visualized. Left Kidney: Length:  10.5 cm. 1 cm cyst is noted. Echogenicity within normal limits. No mass or hydronephrosis visualized. Abdominal aorta: No aneurysm visualized. Other findings: None. IMPRESSION: Cholelithiasis is noted with focal gallbladder wall thickening and probable 8 mm cyst seen within gallbladder wall. If there is clinical concern for cholecystitis, HIDA scan is recommended for further evaluation. Pancreas not visualized due to overlying bowel gas. Electronically Signed   By: Marijo Conception M.D.   On: 08/31/2019 16:48    Assessment/Plan: Discussed situation with patient's wife and Hospitalist.  On keppra now for seizure prophylaxis.  No role for neurosurgical intervention.  Call with any questions.    LOS: 3 days    Peggyann Shoals, MD 09/02/2019, 10:46 AM

## 2019-09-03 LAB — GLUCOSE, CAPILLARY
Glucose-Capillary: 156 mg/dL — ABNORMAL HIGH (ref 70–99)
Glucose-Capillary: 169 mg/dL — ABNORMAL HIGH (ref 70–99)
Glucose-Capillary: 200 mg/dL — ABNORMAL HIGH (ref 70–99)
Glucose-Capillary: 202 mg/dL — ABNORMAL HIGH (ref 70–99)
Glucose-Capillary: 218 mg/dL — ABNORMAL HIGH (ref 70–99)
Glucose-Capillary: 222 mg/dL — ABNORMAL HIGH (ref 70–99)
Glucose-Capillary: 243 mg/dL — ABNORMAL HIGH (ref 70–99)

## 2019-09-03 LAB — BASIC METABOLIC PANEL
Anion gap: 12 (ref 5–15)
BUN: 48 mg/dL — ABNORMAL HIGH (ref 8–23)
CO2: 14 mmol/L — ABNORMAL LOW (ref 22–32)
Calcium: 8.6 mg/dL — ABNORMAL LOW (ref 8.9–10.3)
Chloride: 116 mmol/L — ABNORMAL HIGH (ref 98–111)
Creatinine, Ser: 3.1 mg/dL — ABNORMAL HIGH (ref 0.61–1.24)
GFR calc Af Amer: 21 mL/min — ABNORMAL LOW (ref >60)
GFR calc non Af Amer: 18 mL/min — ABNORMAL LOW (ref >60)
Glucose, Bld: 255 mg/dL — ABNORMAL HIGH (ref 70–99)
Potassium: 4.1 mmol/L (ref 3.5–5.1)
Sodium: 142 mmol/L (ref 135–145)

## 2019-09-03 NOTE — Progress Notes (Signed)
  NEUROSURGERY PROGRESS NOTE   No issues overnight.  Sitting upright eating breakfast with wife  EXAM:  BP 114/77 (BP Location: Right Arm)   Pulse 65   Temp 98 F (36.7 C) (Oral)   Resp 18   Ht 5\' 11"  (1.803 m)   Wt 93.6 kg   SpO2 100%   BMI 28.78 kg/m   Awake, alert Eating breakfast Speech slow MAEW  PLAN Stable neurologically. No indication for surgery. Wife reports even if surgery was necessary, she would not pursue it given age and comorbidities.  Will sign off. Please call for any concerns

## 2019-09-03 NOTE — Plan of Care (Signed)
Progressing towards goals

## 2019-09-03 NOTE — Progress Notes (Signed)
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 In the ED, patient was noted to have right-sided weakness. Code stroke activated. Neurology consulted. He was also diagnosed with liver cirrhosis 3 years ago. According to her, she has been giving him lactulose every 5 hours religiously and that he has couple of bowel movements every day. Patient was found to have significantly elevated ammonia level around 195-->now84.COVID negative. Rest of the lab work was unremarkable.CT/MRI head showed 30 mm late subacute to chronic subdural hematoma on left frontal lobe. No new hemorrhage. 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 Per neurology,do not anticipate that he will be completely normal and would require time for improvement.They feel his seizure risk is adequately controlled with Dilantin. They recommend correction of metabolic derangements/ammonia and SBP to be<160.    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  Not a surgical candidate.  Initially on Dilantin but was switched to Keppra due to thrombocytopenia. - Supportive care, outpatient follow-up.  Decompensated liver cirrhosis with hepatic encephalopathy - Continue lactulose/rifaximin.  Titrate to 3-4 bowel movements daily. - Hold Lasix and Aldactone due to acute kidney injury  Acute kidney injury, improving -Baseline per outside hospital 3.0.  On admission 4.8, trended down to 3.1.  Thrombocytopenia, chronic -Seen by oncology.  Platelet antibody profile ordered  Dysphagia - Advanced age and due to multiple ongoing issues.  Currently on dysphagia 1 diet  Diabetes mellitus type 2 - Hemoglobin A1c 7.3.  On Accu-Chek and sliding scale.  Essential hypertension -On losartan 25 mg daily  BPH -On Proscar 5 mg daily  DVT prophylaxis: Cytopenia Code Status: DNR Family Communication: Wife at bedside Disposition Plan: Maintain hospital stay until his mentation is improved.  Likely home tomorrow  Consultants:   Neurosurgery  Procedures:   None  Antimicrobials:   None   Subjective: Patient was slightly confused overnight and did not get much rest but this morning he is resting comfortably.  Easily arousable and does not have any complaints.  Wife is at the bedside who tells me eventually she wants to take him home instead of rehab.  Review of Systems Otherwise negative except as per HPI, including: General: Denies fever, chills, night sweats or unintended weight loss. Resp: Denies cough, wheezing, shortness of breath. Cardiac: Denies chest pain, palpitations, orthopnea, paroxysmal nocturnal dyspnea. GI: Denies abdominal pain, nausea, vomiting, diarrhea or constipation GU: Denies dysuria, frequency, hesitancy or incontinence MS: Denies muscle aches, joint pain or swelling Neuro: Denies headache, neurologic deficits (focal weakness,  numbness, tingling), abnormal gait Psych: Denies anxiety, depression, SI/HI/AVH Skin: Denies new rashes or  lesions ID: Denies sick contacts, exotic exposures, travel  Objective: Vitals:   09/03/19 0336 09/03/19 0500 09/03/19 0600 09/03/19 0757  BP: 140/86   114/77  Pulse: 66 62 63 65  Resp: 18 17 16 18   Temp: 98.2 F (36.8 C)   98 F (36.7 C)  TempSrc: Oral   Oral  SpO2: 99% 100% 100% 100%  Weight:      Height:        Intake/Output Summary (Last 24 hours) at 09/03/2019 1159 Last data filed at 09/03/2019 0900 Gross per 24 hour  Intake 480 ml  Output -  Net 480 ml   Filed Weights   08/31/19 2000  Weight: 93.6 kg    Examination:  General exam: Appears calm and comfortable  Respiratory system: Bibasilar rhonchi. Cardiovascular system: S1 & S2 heard, RRR. No JVD, murmurs, rubs, gallops or clicks. No pedal edema. Gastrointestinal system: Abdomen is nondistended, soft and nontender. No organomegaly or masses felt. Normal bowel sounds heard. Central nervous system:  No focal neurological deficits. Extremities: Symmetric 4 x 5 power. Skin: No rashes, lesions or ulcers Psychiatry: Poor insight and judgment.  Alert to his name only intermittently is able to say he is in [place].  External Foley in place  Data Reviewed:   CBC: Recent Labs  Lab 08/30/19 1734 08/31/19 0508 09/01/19 0407 09/02/19 0353  WBC 5.6 8.9 6.2 6.6  NEUTROABS 3.8  --  4.4  --   HGB 11.5* 13.3 11.1* 10.7*  HCT 35.2* 38.9* 33.8* 32.2*  MCV 96.7 92.8 95.5 94.2  PLT 61* 45* 29* 40*   Basic Metabolic Panel: Recent Labs  Lab 08/30/19 1734 08/31/19 0508 09/01/19 0407 09/02/19 0353 09/03/19 0841  NA 139 140 140 142 142  K 4.0 4.3 4.3 3.6 4.1  CL 117* 113* 112* 117* 116*  CO2 12* 16* 16* 14* 14*  GLUCOSE 127* 144* 149* 170* 255*  BUN 51* 53* 55* 53* 48*  CREATININE 4.82* 4.63* 3.93* 3.46* 3.10*  CALCIUM 9.3 9.4 8.8* 8.5* 8.6*  MG  --   --  1.8  --   --    GFR: Estimated Creatinine  Clearance: 22.6 mL/min (A) (by C-G formula based on SCr of 3.1 mg/dL (H)). Liver Function Tests: Recent Labs  Lab 08/30/19 1734 08/31/19 0508 09/01/19 0407  AST 21 20 25   ALT 23 24 21   ALKPHOS 113 107 105  BILITOT 2.1* 2.7* 2.8*  PROT 7.2 7.0 6.8  ALBUMIN 2.8* 2.9* 2.7*   No results for input(s): LIPASE, AMYLASE in the last 168 hours. Recent Labs  Lab 08/30/19 1735 08/31/19 0508 09/01/19 1335 09/02/19 0353  AMMONIA 195* 84* 163* 193*   Coagulation Profile: Recent Labs  Lab 08/30/19 1734  INR 1.6*   Cardiac Enzymes: No results for input(s): CKTOTAL, CKMB, CKMBINDEX, TROPONINI in the last 168 hours. BNP (last 3 results) No results for input(s): PROBNP in the last 8760 hours. HbA1C: No results for input(s): HGBA1C in the last 72 hours. CBG: Recent Labs  Lab 09/02/19 1609 09/02/19 2014 09/03/19 0028 09/03/19 0338 09/03/19 0756  GLUCAP 185* 229* 200* 156* 169*   Lipid Profile: No results for input(s): CHOL, HDL, LDLCALC, TRIG, CHOLHDL, LDLDIRECT in the last 72 hours. Thyroid Function Tests: No results for input(s): TSH, T4TOTAL, FREET4, T3FREE, THYROIDAB in the last 72 hours. Anemia Panel: No results for input(s): VITAMINB12, FOLATE, FERRITIN, TIBC, IRON, RETICCTPCT in the last 72 hours. Sepsis Labs: Recent Labs  Lab 08/30/19 1737  LATICACIDVEN 2.0*  Recent Results (from the past 240 hour(s))  SARS Coronavirus 2 [facility] order, Performed in [facility] hospital lab) Nasopharyngeal Nasopharyngeal Swab     Status: None   Collection Time: 08/30/19  8:21 PM   Specimen: Nasopharyngeal Swab  Result Value Ref Range Status   SARS Coronavirus 2 NEGATIVE NEGATIVE Final    Comment: (NOTE) If result is NEGATIVE SARS-CoV-2 target nucleic acids are NOT DETECTED. The SARS-CoV-2 RNA is generally detectable in upper and lower  respiratory specimens during the acute phase of infection. The lowest  concentration of SARS-CoV-2 viral copies this assay can detect is 250   copies / mL. A negative result does not preclude SARS-CoV-2 infection  and should not be used as the sole basis for treatment or other  patient management decisions.  A negative result may occur with  improper specimen collection / handling, submission of specimen other  than nasopharyngeal swab, presence of viral mutation(s) within the  areas targeted by this assay, and inadequate number of viral copies  (<250 copies / mL). A negative result must be combined with clinical  observations, patient history, and epidemiological information. If result is POSITIVE SARS-CoV-2 target nucleic acids are DETECTED. The SARS-CoV-2 RNA is generally detectable in upper and lower  respiratory specimens dur ing the acute phase of infection.  Positive  results are indicative of active infection with SARS-CoV-2.  Clinical  correlation with patient history and other diagnostic information is  necessary to determine patient infection status.  Positive results do  not rule out bacterial infection or co-infection with other viruses. If result is PRESUMPTIVE POSTIVE SARS-CoV-2 nucleic acids MAY BE PRESENT.   A presumptive positive result was obtained on the submitted specimen  and confirmed on repeat testing.  While 2019 novel coronavirus  (SARS-CoV-2) nucleic acids may be present in the submitted sample  additional confirmatory testing may be necessary for epidemiological  and / or clinical management purposes  to differentiate between  SARS-CoV-2 and other Sarbecovirus currently known to infect humans.  If clinically indicated additional testing with an alternate test  methodology [phone]) is advised. The SARS-CoV-2 RNA is generally  detectable in upper and lower respiratory sp ecimens during the acute  phase of infection. The expected result is Negative. Fact Sheet for Patients:  StrictlyIdeas.no Fact Sheet for Healthcare Providers: BankingDealers.co.za  This test is not yet approved or cleared by the [place] FDA and has been authorized for detection and/or diagnosis of SARS-CoV-2 by FDA under an Emergency Use Authorization (EUA).  This EUA will remain in effect (meaning this test can be used) for the duration of the COVID-19 declaration under Section 564(b)(1) of the Act, 21 U.S.C. section 360bbb-3(b)(1), unless the authorization is terminated or revoked sooner. Performed at [facility], [place] [address] [place], [place] [address]          Radiology Studies: No results found.      Scheduled Meds: . finasteride  5 mg Oral Daily  . insulin aspart  0-9 Units Subcutaneous Q4H  . lactulose  30 g Oral 5 X Daily  . levETIRAcetam  250 mg Oral BID  . losartan  25 mg Oral Daily  . rifaximin  550 mg Oral BID   Continuous Infusions:   LOS: 4 days   Time spent= 25 mins    Ankit Arsenio Loader, MD [facility]  If 7PM-7AM, please contact night-coverage [email or web address] 09/03/2019, 11:59 AM

## 2019-09-03 NOTE — Care Management Important Message (Signed)
Important Message  Patient Details  Name: Dennis Mcneil MRN: [id number] Date of Birth: 1941/01/09   Medicare Important Message Given:  Yes     [name] [address] 09/03/2019, 4:01 PM

## 2019-09-03 NOTE — Progress Notes (Signed)
  Speech Language Pathology Treatment: Dysphagia  Patient Details Name: Dennis Mcneil MRN: [id number] DOB: [birth year] Today's Date: 09/03/2019 Time: [id number] SLP Time Calculation (min) (ACUTE ONLY): 17 min  Assessment / Plan / Recommendation Clinical Impression  Pt has had his NGT removed and no longer exhibits multiple subswallows per bolus. He has prolonged oral preparation, which his wife describes as his baseline since he stopped wearing his dentures earlier this summer. She has been chopping his foods at home or offering him soft textures (applesauce, broth, etc.). Throat clearing was noted after a particularly large bite of graham cracker but otherwise he appeared to consume POs without overt s/s of aspiration. Recommend advancing diet to Dys 2 solids and thin liquids. He and his wife are in agreement - will f/u briefly for tolerance.   HPI HPI: Pt is a 78 yo male admitted with AMS over the past week; R sided weakness noted in the ED. Pt had a recent admission in July 2020 at [facility] for SDH s/p fall. CT Head upon admission showed a subacute L frontal SDH; MRI pending. PMH includes: HTN, DM, cirrhosis, CKD, HLD, gout      SLP Plan  Continue with current plan of care       Recommendations  Diet recommendations: Dysphagia 2 (fine chop);Thin liquid Liquids provided via: Cup;Straw Medication Administration: Crushed with puree Supervision: Staff to assist with self feeding;Full supervision/cueing for compensatory strategies Compensations: Minimize environmental distractions;Slow rate;Small sips/bites Postural Changes and/or Swallow Maneuvers: Seated upright 90 degrees                Oral Care Recommendations: Oral care BID Follow up Recommendations: 24 hour supervision/assistance;Home health SLP(per wife, was receiving HH SLP PTA) SLP Visit Diagnosis: Dysphagia, unspecified (R13.10) Plan: Continue with current plan of care       GO                Venita Sheffield Elainah Rhyne 09/03/2019,  11:12 AM  [name], M.A. [place] Acute Environmental education officer [phone] Office [phone]

## 2019-09-04 LAB — CBC
HCT: 30 % — ABNORMAL LOW (ref 39.0–52.0)
Hemoglobin: 10.1 g/dL — ABNORMAL LOW (ref 13.0–17.0)
MCH: 32.7 pg (ref 26.0–34.0)
MCHC: 33.7 g/dL (ref 30.0–36.0)
MCV: 97.1 fL (ref 80.0–100.0)
Platelets: 40 10*3/uL — ABNORMAL LOW (ref 150–400)
RBC: 3.09 MIL/uL — ABNORMAL LOW (ref 4.22–5.81)
RDW: 15.5 % (ref 11.5–15.5)
WBC: 5.6 10*3/uL (ref 4.0–10.5)
nRBC: 0 % (ref 0.0–0.2)

## 2019-09-04 LAB — BASIC METABOLIC PANEL
Anion gap: 6 (ref 5–15)
BUN: 44 mg/dL — ABNORMAL HIGH (ref 8–23)
CO2: 16 mmol/L — ABNORMAL LOW (ref 22–32)
Calcium: 8.6 mg/dL — ABNORMAL LOW (ref 8.9–10.3)
Chloride: 119 mmol/L — ABNORMAL HIGH (ref 98–111)
Creatinine, Ser: 3.04 mg/dL — ABNORMAL HIGH (ref 0.61–1.24)
GFR calc Af Amer: 22 mL/min — ABNORMAL LOW (ref >60)
GFR calc non Af Amer: 19 mL/min — ABNORMAL LOW (ref >60)
Glucose, Bld: 222 mg/dL — ABNORMAL HIGH (ref 70–99)
Potassium: 3.6 mmol/L (ref 3.5–5.1)
Sodium: 141 mmol/L (ref 135–145)

## 2019-09-04 LAB — GLUCOSE, CAPILLARY: Glucose-Capillary: 180 mg/dL — ABNORMAL HIGH (ref 70–99)

## 2019-09-04 LAB — MAGNESIUM: Magnesium: 1.8 mg/dL (ref 1.7–2.4)

## 2019-09-04 LAB — AMMONIA: Ammonia: 211 umol/L — ABNORMAL HIGH (ref 9–35)

## 2019-09-04 MED ORDER — POTASSIUM CHLORIDE 20 MEQ/15ML (10%) PO SOLN
40.0000 meq | Freq: Once | ORAL | Status: AC
  Administered 2019-09-04: 40 meq via ORAL
  Filled 2019-09-04: qty 30

## 2019-09-04 MED ORDER — POTASSIUM CHLORIDE 10 MEQ/100ML IV SOLN
10.0000 meq | INTRAVENOUS | Status: AC
  Administered 2019-09-04 (×2): 10 meq via INTRAVENOUS
  Filled 2019-09-04: qty 100

## 2019-09-04 MED ORDER — LEVETIRACETAM 250 MG PO TABS: 250.0000 mg | ORAL_TABLET | Freq: Two times a day (BID) | ORAL | 0 refills | Status: AC

## 2019-09-04 MED ORDER — POTASSIUM CHLORIDE 10 MEQ/100ML IV SOLN
10.0000 meq | INTRAVENOUS | Status: AC
  Administered 2019-09-04: 10 meq via INTRAVENOUS
  Filled 2019-09-04: qty 100

## 2019-09-04 NOTE — Progress Notes (Signed)
Inpatient Diabetes Program Recommendations  AACE/ADA: New Consensus Statement on Inpatient Glycemic Control (2015)  Target Ranges:  Prepandial:   less than 140 mg/dL      Peak postprandial:   less than 180 mg/dL (1-2 hours)      Critically ill patients:  140 - 180 mg/dL   Lab Results  Component Value Date   GLUCAP 180 (H) 09/04/2019    Review of Glycemic Control Results for Dennis Mcneil, Dennis Mcneil (MRN [id number]) as of 09/04/2019 09:37  Ref. Range 09/03/2019 07:56 09/03/2019 12:02 09/03/2019 16:29 09/03/2019 19:57 09/03/2019 23:35 09/04/2019 04:02  Glucose-Capillary Latest Ref Range: 70 - 99 mg/dL 169 (H) 202 (H) 243 (H) 222 (H) 218 (H) 180 (H)   Diabetes history: DM 2  Current orders for Inpatient glycemic control:  Novolog 0-9 units Q4 hours  Inpatient Diabetes Program Recommendations:    Consider Levemir 6-8 units  Thanks,  Tama Headings RN, MSN, BC-ADM Inpatient Diabetes Coordinator Team Pager [phone] (8a-5p)

## 2019-09-04 NOTE — Plan of Care (Signed)
Adequate for discharge.

## 2019-09-04 NOTE — Discharge Summary (Signed)
Physician Discharge Summary  Dennis Mcneil [id number] DOB: Sep 25, 1941 DOA: 08/30/2019  PCP: Patient, No Pcp Per  Admit date: 08/30/2019 Discharge date: 09/04/2019  Admitted From: Home Disposition: Home with hospice  Recommendations for Outpatient Follow-up:  1. Follow up with PCP in 1-2 weeks 2. Please obtain BMP/CBC in one week your next doctors visit.  3. Prescribed Keppra 4. 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 In the ED, patient was noted to have right-sided weakness. Code stroke activated. Neurology consulted. He was also diagnosed with liver cirrhosis 3 years ago. According to her, she has been giving him lactulose every 5 hours religiously and that he has couple of bowel movements every day. Patient was found to have significantly elevated ammonia level around 195-->now84.COVID negative. Rest of the lab work was unremarkable.CT/MRI head showed 30 mm late subacute to chronic subdural hematoma on left frontal lobe. No new hemorrhage. 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 Per neurology,do not anticipate that he will be completely normal and would require time for improvement.They feel his seizure risk is adequately controlled with Dilantin.  This was switched to Keppra due to thrombocytopenia.  They  recommend correction of metabolic derangements/ammonia and SBP to be<160.  Issues with dysphagia therefore evaluated and was determined to have dysphagia 2 diet. We will discharge the patient today.  Had extensive discussion with the patient and his wife, plan on transitioning him home with hospice.   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  Not a surgical candidate.  Initially on Dilantin but was switched to Keppra due to thrombocytopenia.  Will discharge on Keppra - Supportive care, outpatient follow-up.  Decompensated liver cirrhosis with hepatic encephalopathy - Continue lactulose/rifaximin.  Titrate to 3-4 bowel movements daily. -  Resume home Lasix and Aldactone, renal function at baseline.  May need to discontinue over time as he is at risk of dehydration.  Acute kidney injury, improving -Baseline per outside hospital 3.0.    At baseline of 3.0  Thrombocytopenia, chronic -Seen by oncology.  Platelet antibody profile ordered  Dysphagia - Advanced age and due to multiple ongoing issues.  Currently on dysphagia 2 diet  Diabetes mellitus type 2 - Hemoglobin A1c 7.3.  On Accu-Chek and sliding scale.  Essential hypertension -On losartan 25 mg daily  BPH -On Proscar 5 mg daily  Had extensive goals of care discussion with the patient's wife-we will transition patient home with hospice.  Consultations:  None  Subjective: Patient is awake alert to his name, answers all the basic questions.  No other complaints.  Discharge  Exam: Vitals:   09/04/19 0400  09/04/19 0713  BP: 105/74 118/74  Pulse:  70  Resp:  14  Temp:  97.8 F (36.6 C)  SpO2:  100%   Vitals:   09/03/19 2000 09/04/19 0000 09/04/19 0400 09/04/19 0713  BP: 135/78 (!) 155/72 105/74 118/74  Pulse:    70  Resp: 18 18  14   Temp:    97.8 F (36.6 C)  TempSrc:    Oral  SpO2:    100%  Weight:      Height:        General: Alert to his name and also place.  He is aware that he's wife is at bedside.  Chronically appears ill and frail. Cardiovascular: RRR, S1/S2 +, no rubs, no gallops Respiratory: CTA bilaterally, no wheezing, no rhonchi Abdominal: Soft, NT, ND, bowel sounds + Extremities: no edema, no cyanosis  External Foley to be removed prior to discharge  Discharge Instructions   Allergies as of 09/04/2019      Reactions   Vitamin B12 Hives      Medication List    TAKE these medications   finasteride 5 MG tablet Commonly known as: PROSCAR Take 5 mg by mouth daily.   furosemide 40 MG tablet Commonly known as: LASIX Take 40 mg by mouth 2 (two) times daily.   lactulose 10 GM/15ML solution Commonly known as: CHRONULAC Take 20 g by mouth 3 (three) times daily.   levETIRAcetam 250 MG tablet Commonly known as: KEPPRA Take 1 tablet (250 mg total) by mouth 2 (two) times daily.   losartan 25 MG tablet Commonly known as: COZAAR Take 25 mg by mouth daily.   rifaximin 550 MG Tabs tablet Commonly known as: XIFAXAN Take 550 mg by mouth 2 (two) times daily.   spironolactone 25 MG tablet Commonly known as: ALDACTONE Take 25 mg by mouth daily.       Allergies  Allergen Reactions  . Vitamin B12 Hives    You were cared for by a hospitalist during your hospital stay. If you have any questions about your discharge medications or the care you received while you were in the hospital after you are discharged, you can call the unit and asked to speak with the hospitalist on call if the hospitalist that took care of you is not  available. Once you are discharged, your primary care physician will handle any further medical issues. Please note that no refills for any discharge medications will be authorized once you are discharged, as it is imperative that you return to your primary care physician (or establish a relationship with a primary care physician if you do not have one) for your aftercare needs so that they can reassess your need for medications and monitor your lab values.   Procedures/Studies: Mr Brain Wo Contrast  Result Date: 08/31/2019 CLINICAL DATA:  78 year old male with complicated diabetes. Subdural hematoma in July this year. Altered mental status, encephalopathy now. EXAM: MRI HEAD WITHOUT CONTRAST TECHNIQUE: Multiplanar, multiecho pulse sequences of the brain and surrounding structures were obtained without intravenous contrast. COMPARISON:  Head CT 08/30/2019. FINDINGS: Brain: Mixed signal biconvex left side subdural hematoma measures up to 13 millimeters in thickness on series 9, image 15. Stable mild mass effect on the left hemisphere. Only trace rightward midline shift. Basilar cisterns remain patent. Fairly numerous scattered chronic microhemorrhages in the brain which are most concentrated in the bilateral deep gray nuclei (series 3, image 56) and the brainstem. No intraventricular or other extra-axial hemorrhage. No restricted diffusion to suggest acute infarction.  No ventriculomegaly. Cervicomedullary junction and pituitary are within normal limits. Patchy and confluent bilateral cerebral white matter T2 and FLAIR hyperintensity. No definite cortical encephalomalacia. Chronic deep gray nuclei lacunar infarcts. Linear Wallerian degeneration or chronic ischemia in the pons on series 7, image 8. Negative cerebellum. Vascular: Loss of the distal left vertebral artery flow void on series 7, image 3. Other Major intracranial vascular flow voids are preserved. Skull and upper cervical spine: Negative visible  cervical spine. Normal bone marrow signal. Sinuses/Orbits: Negative orbits. Trace to mild paranasal sinus mucosal thickening. Other: Mastoids remain clear. There is a left side nasoenteric tube in place. IMPRESSION: 1. Left side subdural hematoma with mixed signal measures up to 13 mm in thickness and is stable from the CT yesterday. Mild mass effect on the left hemisphere is unchanged. 2. No other acute intracranial abnormality. 3. Underlying advanced chronic micro-hemorrhages in the deep gray nuclei, and to a lesser extent elsewhere in the brain. 4. Advanced cerebral white matter signal changes also likely small vessel disease related. Electronically Signed   By: Genevie Ann M.D.   On: 08/31/2019 16:59   US Abdomen Complete  Result Date: 08/31/2019 CLINICAL DATA:  Elevated liver function tests. EXAM: ABDOMEN ULTRASOUND COMPLETE COMPARISON:  Ultrasound of October 26, 2017. FINDINGS: Gallbladder: 2.3 cm gallstone is noted. Moderate gallbladder wall thickening is noted focally with possible 8 mm cyst. No sonographic Murphy's sign or pericholecystic fluid is noted. Common bile duct: Diameter: 4 mm which is within normal limits. Liver: No focal lesion identified. Within normal limits in parenchymal echogenicity. Portal vein is patent on color Doppler imaging with normal direction of blood flow towards the liver. IVC: No abnormality visualized. Pancreas: Not visualized due to overlying bowel gas. Spleen: Size and appearance within normal limits. Right Kidney: Length: 9.2 cm. Echogenicity within normal limits. No mass or hydronephrosis visualized. Left Kidney: Length: 10.5 cm. 1 cm cyst is noted. Echogenicity within normal limits. No mass or hydronephrosis visualized. Abdominal aorta: No aneurysm visualized. Other findings: None. IMPRESSION: Cholelithiasis is noted with focal gallbladder wall thickening and probable 8 mm cyst seen within gallbladder wall. If there is clinical concern for cholecystitis, HIDA scan is  recommended for further evaluation. Pancreas not visualized due to overlying bowel gas. Electronically Signed   By: Marijo Conception M.D.   On: 08/31/2019 16:48   Dg Chest Portable 1 View  Result Date: 08/31/2019 CLINICAL DATA:  MRI clearance EXAM: PORTABLE CHEST 1 VIEW COMPARISON:  None. FINDINGS: NG tube enters the stomach. Cardiomegaly with vascular congestion. No confluent opacities, effusions or edema. No radiopaque foreign bodies. No pacer or other cardiac device seen. IMPRESSION: Cardiomegaly, vascular congestion. Electronically Signed   By: Rolm Baptise M.D.   On: 08/31/2019 00:22   Dg Abd Portable 1 View  Result Date: 08/30/2019 CLINICAL DATA:  NG tube placement EXAM: PORTABLE ABDOMEN - 1 VIEW COMPARISON:  06/25/2019 FINDINGS: Esophagogastric tube is positioned with tip and side port below the diaphragm, tip positioned over the gastric body. Nonobstructive pattern of partially imaged bowel gas. IMPRESSION: Esophagogastric tube is positioned with tip and side port below the diaphragm, tip positioned over the gastric body. Electronically Signed   By: Eddie Candle M.D.   On: 08/30/2019 20:27   Ct Head Code Stroke Wo Contrast  Result Date: 08/30/2019 CLINICAL DATA:  Code stroke.  Altered level of consciousness. EXAM: CT HEAD WITHOUT CONTRAST TECHNIQUE: Contiguous axial images were obtained from the base of the skull through the vertex without intravenous contrast.  COMPARISON:  CT head 07/29/2019, 06/25/2019 FINDINGS: Brain: Low-density subdural hematoma on the left measures 13 mm in thickness on coronal images. This is mildly improved in size since the prior study. High density blood products seen on the prior study in the subdural hematoma have resolved. No acute or new hemorrhage. 2 mm midline shift to the right unchanged. Ventricle size normal. Chronic microvascular ischemic changes in the white matter and left basal ganglia stable. No acute infarct or mass. Vascular: Atherosclerotic calcification  distal vertebral arteries and bilateral carotid arteries. Skull: Negative Sinuses/Orbits: Mild mucosal edema paranasal sinuses.  Normal orbit. Other: None ASPECTS ([place] Stroke Program Early CT Score) - Ganglionic level infarction (caudate, lentiform nuclei, internal capsule, insula, M1-M3 cortex): 7 - Supraganglionic infarction (M4-M6 cortex): 3 Total score (0-10 with 10 being normal): 10 IMPRESSION: 1. 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 No new area of hemorrhage. 2. No acute ischemic infarct. 3. ASPECTS is 10 4. These results were called by telephone at the time of interpretation on 08/30/2019 at 6:01 pm to provider Rory Percy , who verbally acknowledged these results. Electronically Signed   By: Franchot Gallo M.D.   On: 08/30/2019 18:02      The results of significant diagnostics from this hospitalization (including imaging, microbiology, ancillary and laboratory) are listed below for reference.     Microbiology: Recent Results (from the past 240 hour(s))  SARS Coronavirus 2 [facility] order, Performed in [facility] hospital lab) Nasopharyngeal Nasopharyngeal Swab     Status: None   Collection Time: 08/30/19  8:21 PM   Specimen: Nasopharyngeal Swab  Result Value Ref Range Status   SARS Coronavirus 2 NEGATIVE NEGATIVE Final    Comment: (NOTE) If result is NEGATIVE SARS-CoV-2 target nucleic acids are NOT DETECTED. The SARS-CoV-2 RNA is generally detectable in upper and lower  respiratory specimens during the acute phase of infection. The lowest  concentration of SARS-CoV-2 viral copies this assay can detect is 250  copies / mL. A negative result does not preclude SARS-CoV-2 infection  and should not be used as the sole basis for treatment or other  patient management decisions.  A negative result may occur with  improper specimen collection / handling, submission of specimen other  than nasopharyngeal swab,  presence of viral mutation(s) within the  areas targeted by this assay, and inadequate number of viral copies  (<250 copies / mL). A negative result must be combined with clinical  observations, patient history, and epidemiological information. If result is POSITIVE SARS-CoV-2 target nucleic acids are DETECTED. The SARS-CoV-2 RNA is generally detectable in upper and lower  respiratory specimens dur ing the acute phase of infection.  Positive  results are indicative of active infection with SARS-CoV-2.  Clinical  correlation with patient history and other diagnostic information is  necessary to determine patient infection status.  Positive results do  not rule out bacterial infection or co-infection with other viruses. If result is PRESUMPTIVE POSTIVE SARS-CoV-2 nucleic acids MAY BE PRESENT.   A presumptive positive result was obtained on the submitted specimen  and confirmed on repeat testing.  While 2019 novel coronavirus  (SARS-CoV-2) nucleic acids may be present in the submitted sample  additional confirmatory testing may be necessary for epidemiological  and / or clinical management purposes  to differentiate between  SARS-CoV-2 and other Sarbecovirus currently known to infect humans.  If clinically indicated additional testing with an alternate test  methodology (  [id number]) is advised. The SARS-CoV-2 RNA is generally  detectable in upper and lower respiratory sp ecimens during the acute  phase of infection. The expected result is Negative. Fact Sheet for Patients:  StrictlyIdeas.no Fact Sheet for Healthcare Providers: BankingDealers.co.za This test is not yet approved or cleared by the [place] FDA and has been authorized for detection and/or diagnosis of SARS-CoV-2 by FDA under an Emergency Use Authorization (EUA).  This EUA will remain in effect (meaning this test can be used) for the duration of the COVID-19 declaration under  Section 564(b)(1) of the Act, 21 U.S.C. section 360bbb-3(b)(1), unless the authorization is terminated or revoked sooner. Performed at [facility], [place] [address] [place], [place] [address]      Labs: BNP (last 3 results) No results for input(s): BNP in the last 8760 hours. Basic Metabolic Panel: Recent Labs  Lab 08/31/19 0508 09/01/19 0407 09/02/19 0353 09/03/19 0841 09/04/19 0335  NA 140 140 142 142 141  K 4.3 4.3 3.6 4.1 3.6  CL 113* 112* 117* 116* 119*  CO2 16* 16* 14* 14* 16*  GLUCOSE 144* 149* 170* 255* 222*  BUN 53* 55* 53* 48* 44*  CREATININE 4.63* 3.93* 3.46* 3.10* 3.04*  CALCIUM 9.4 8.8* 8.5* 8.6* 8.6*  MG  --  1.8  --   --  1.8   Liver Function Tests: Recent Labs  Lab 08/30/19 1734 08/31/19 0508 09/01/19 0407  AST 21 20 25   ALT 23 24 21   ALKPHOS 113 107 105  BILITOT 2.1* 2.7* 2.8*  PROT 7.2 7.0 6.8  ALBUMIN 2.8* 2.9* 2.7*   No results for input(s): LIPASE, AMYLASE in the last 168 hours. Recent Labs  Lab 08/30/19 1735 08/31/19 0508 09/01/19 1335 09/02/19 0353 09/04/19 0335  AMMONIA 195* 84* 163* 193* 211*   CBC: Recent Labs  Lab 08/30/19 1734 08/31/19 0508 09/01/19 0407 09/02/19 0353 09/04/19 0335  WBC 5.6 8.9 6.2 6.6 5.6  NEUTROABS 3.8  --  4.4  --   --   HGB 11.5* 13.3 11.1* 10.7* 10.1*  HCT 35.2* 38.9* 33.8* 32.2* 30.0*  MCV 96.7 92.8 95.5 94.2 97.1  PLT 61* 45* 29* 40* 40*   Cardiac Enzymes: No results for input(s): CKTOTAL, CKMB, CKMBINDEX, TROPONINI in the last 168 hours. BNP: Invalid input(s): POCBNP CBG: Recent Labs  Lab 09/03/19 1202 09/03/19 1629 09/03/19 1957 09/03/19 2335 09/04/19 0402  GLUCAP 202* 243* 222* 218* 180*   D-Dimer No results for input(s): DDIMER in the last 72 hours. Hgb A1c No results for input(s): HGBA1C in the last 72 hours. Lipid Profile No results for input(s): CHOL, HDL, LDLCALC, TRIG, CHOLHDL, LDLDIRECT in the last 72 hours. Thyroid function studies No results for input(s): TSH,  T4TOTAL, T3FREE, THYROIDAB in the last 72 hours.  Invalid input(s): FREET3 Anemia work up No results for input(s): VITAMINB12, FOLATE, FERRITIN, TIBC, IRON, RETICCTPCT in the last 72 hours. Urinalysis    Component Value Date/Time   COLORURINE YELLOW 08/30/2019 1820   APPEARANCEUR CLEAR 08/30/2019 1820   LABSPEC 1.011 08/30/2019 1820   PHURINE 5.0 08/30/2019 1820   GLUCOSEU NEGATIVE 08/30/2019 1820   HGBUR NEGATIVE 08/30/2019 1820   BILIRUBINUR NEGATIVE 08/30/2019 1820   KETONESUR NEGATIVE 08/30/2019 1820   PROTEINUR NEGATIVE 08/30/2019 1820   NITRITE NEGATIVE 08/30/2019 1820   LEUKOCYTESUR TRACE (A) 08/30/2019 1820   Sepsis Labs Invalid input(s): PROCALCITONIN,  WBC,  LACTICIDVEN Microbiology Recent Results (from the past 240 hour(s))  SARS Coronavirus 2 [facility] order, Performed in [facility] hospital lab) Nasopharyngeal Nasopharyngeal Swab     Status: None   Collection Time: 08/30/19  8:21 PM   Specimen: Nasopharyngeal Swab  Result Value Ref Range Status   SARS Coronavirus 2 NEGATIVE NEGATIVE Final    Comment: (NOTE) If result is NEGATIVE SARS-CoV-2 target nucleic acids are NOT DETECTED. The SARS-CoV-2 RNA is generally detectable in upper and lower  respiratory specimens during the acute phase of infection. The lowest  concentration of SARS-CoV-2 viral copies this assay can detect is 250  copies / mL. A negative result does not preclude SARS-CoV-2 infection  and should not be used as the sole basis for treatment or other  patient management decisions.  A negative result may occur with  improper specimen collection / handling, submission of specimen other  than nasopharyngeal swab, presence of viral mutation(s) within the  areas targeted by this assay, and inadequate number of viral copies  (<250 copies / mL). A negative result must be combined with clinical  observations, patient history, and epidemiological information. If result is POSITIVE SARS-CoV-2 target nucleic  acids are DETECTED. The SARS-CoV-2 RNA is generally detectable in upper and lower  respiratory specimens dur ing the acute phase of infection.  Positive  results are indicative of active infection with SARS-CoV-2.  Clinical  correlation with patient history and other diagnostic information is  necessary to determine patient infection status.  Positive results do  not rule out bacterial infection or co-infection with other viruses. If result is PRESUMPTIVE POSTIVE SARS-CoV-2 nucleic acids MAY BE PRESENT.   A presumptive positive result was obtained on the submitted specimen  and confirmed on repeat testing.  While 2019 novel coronavirus  (SARS-CoV-2) nucleic acids may be present in the submitted sample  additional confirmatory testing may be necessary for epidemiological  and / or clinical management purposes  to differentiate between  SARS-CoV-2 and other Sarbecovirus currently known to infect humans.  If clinically indicated additional testing with an alternate test  methodology [phone]) is advised. The SARS-CoV-2 RNA is generally  detectable in upper and lower respiratory sp ecimens during the acute  phase of infection. The expected result is Negative. Fact Sheet for Patients:  StrictlyIdeas.no Fact Sheet for Healthcare Providers: BankingDealers.co.za This test is not yet approved or cleared by the [place] FDA and has been authorized for detection and/or diagnosis of SARS-CoV-2 by FDA under an Emergency Use Authorization (EUA).  This EUA will remain in effect (meaning this test can be used) for the duration of the COVID-19 declaration under Section 564(b)(1) of the Act, 21 U.S.C. section 360bbb-3(b)(1), unless the authorization is terminated or revoked sooner. Performed at [facility], [place] [address] [place], [place] [address]      Time coordinating discharge:  I have spent 35 minutes face to face with the patient  and on the ward discussing the patients care, assessment, plan and disposition with other care givers. >50% of the time was devoted counseling the patient about the risks and benefits of treatment/Discharge disposition and coordinating care.   SIGNED:   Damita Lack, MD  [facility] 09/04/2019, 10:58 AM   If 7PM-7AM, please contact night-coverage [email or web address]

## 2019-09-04 NOTE — TOC Transition Note (Signed)
Transition of Care [facility]) - CM/SW Discharge Note   Patient Details  Name: Dennis Mcneil MRN: [id number] Date of Birth: 12-02-1941  [facility] CM/SW Contact:  Pollie Friar, RN Phone Number: 09/04/2019, 2:28 PM   Clinical Narrative:    Pt discharging home with hospice through [facility]. Pt has had bed, wheelchair, table delivered to the home.  Tim with [facility] aware of d/c and timing of PTAR.  Pts spouse at the home and verified address. PTAR arranged, d/c packet at the desk and bedside RN updated.    Final next level of care: Home w Home Health Services Barriers to Discharge: No Barriers Identified   Patient Goals and CMS Choice   CMS Medicare.gov Compare Post Acute Care list provided to:: Patient Represenative (must comment) Choice offered to / list presented to : Spouse  Discharge Placement                       Discharge Plan and Services                DME Arranged: Hospital bed       Representative spoke with at DME Agency: arranged per [facility] hospice         Representative spoke with at [place]: [facility] hospice aware of d/c  Social Determinants of Health (SDOH) Interventions     Readmission Risk Interventions No flowsheet data found.

## 2019-09-04 NOTE — Progress Notes (Signed)
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

## 2019-09-05 LAB — GLUCOSE, CAPILLARY
Glucose-Capillary: 176 mg/dL — ABNORMAL HIGH (ref 70–99)
Glucose-Capillary: 247 mg/dL — ABNORMAL HIGH (ref 70–99)
Glucose-Capillary: 202 mg/dL — ABNORMAL HIGH (ref 70–99)

## 2019-10-21 DEATH — deceased

## 2020-12-06 IMAGING — DX DG CHEST 1V PORT
1 series · 1 of 1 positions shown · non-contrast
Comparison: None.

CLINICAL DATA: MRI clearance

EXAM:
PORTABLE CHEST 1 VIEW

[chest]
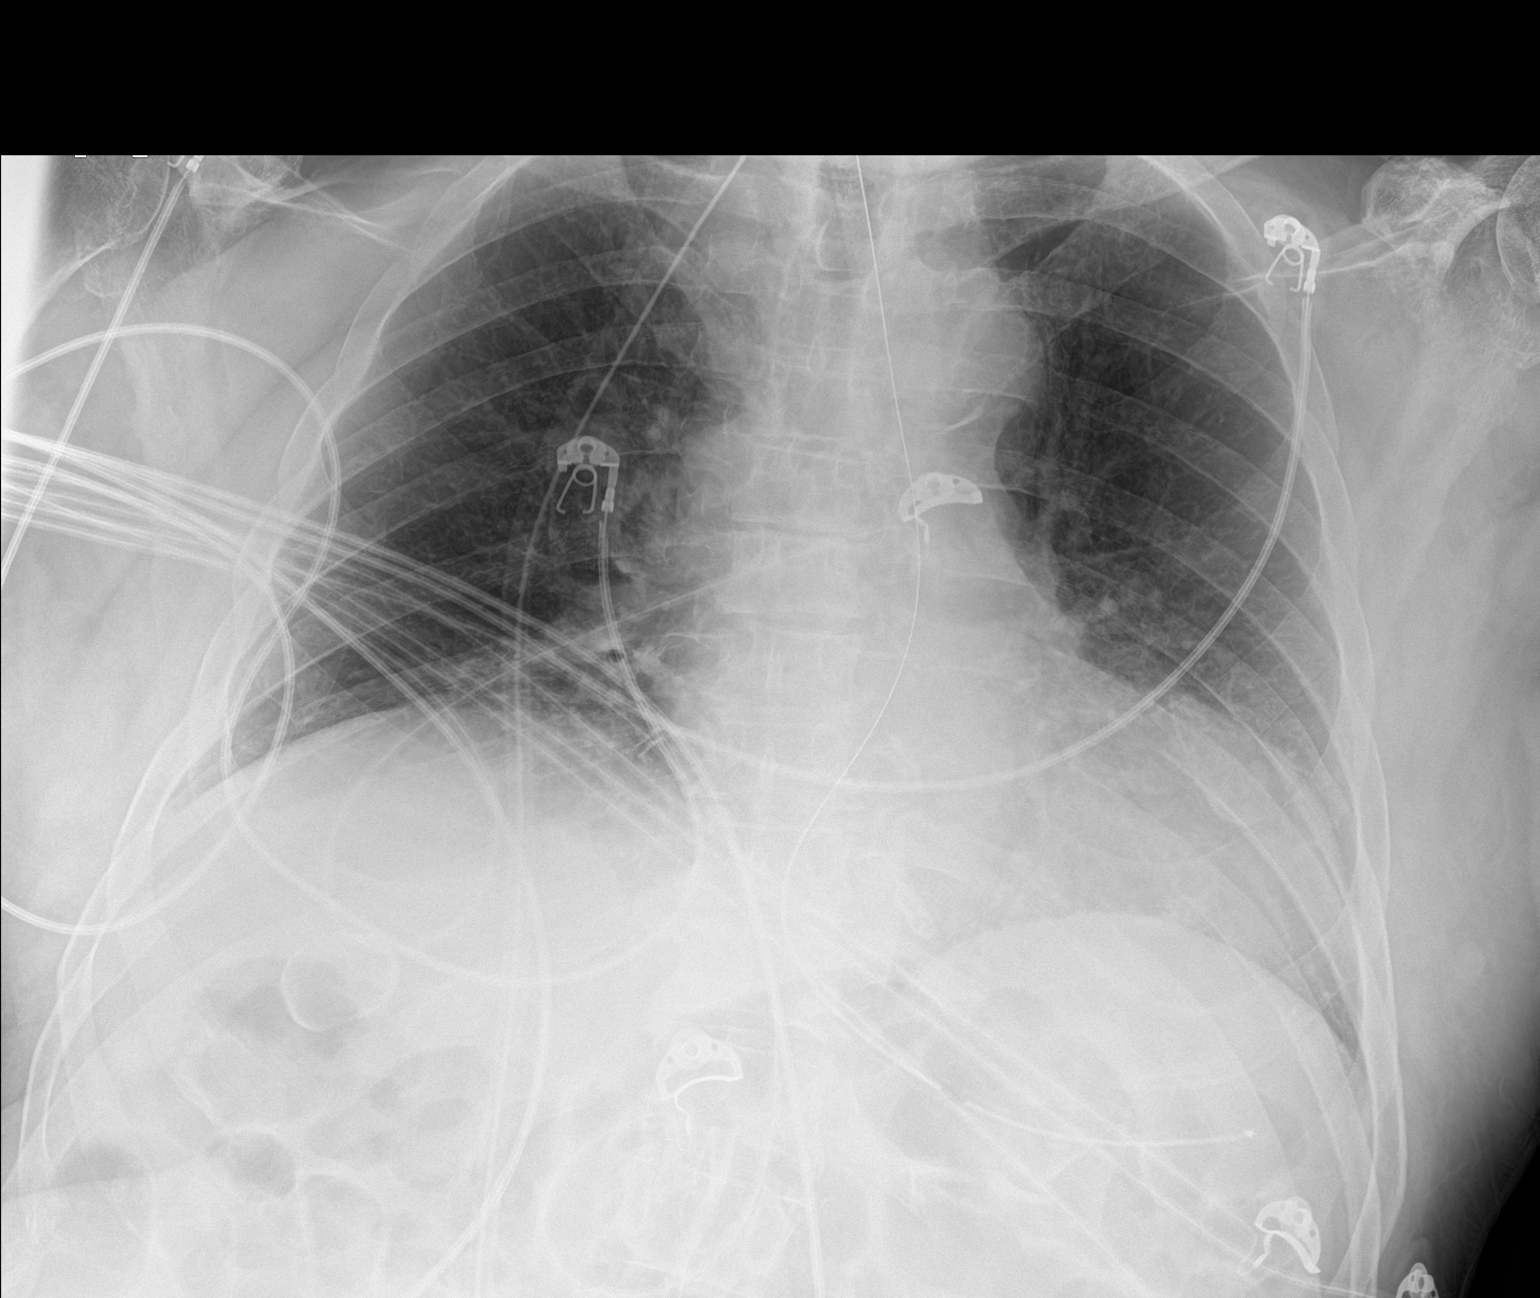

[1 of 1 positions shown; findings below may reference images not displayed]

FINDINGS: NG tube enters the stomach. Cardiomegaly with vascular congestion.
No confluent opacities, effusions or edema. No radiopaque foreign
bodies. No pacer or other cardiac device seen.
IMPRESSION: Cardiomegaly, vascular congestion.
